# Patient Record
Sex: Female | Born: 1945 | Race: Black or African American | Hispanic: No | Marital: Single | Smoking: Never smoker
Health system: Southern US, Community
[De-identification: ages and names within clinical notes are randomized; demographics above are authoritative.]

## PROBLEM LIST (undated history)

## (undated) DIAGNOSIS — C801 Malignant (primary) neoplasm, unspecified: Secondary | ICD-10-CM

## (undated) DIAGNOSIS — I509 Heart failure, unspecified: Secondary | ICD-10-CM

## (undated) DIAGNOSIS — I10 Essential (primary) hypertension: Secondary | ICD-10-CM

## (undated) DIAGNOSIS — I2510 Atherosclerotic heart disease of native coronary artery without angina pectoris: Secondary | ICD-10-CM

## (undated) DIAGNOSIS — E119 Type 2 diabetes mellitus without complications: Secondary | ICD-10-CM

## (undated) DIAGNOSIS — E039 Hypothyroidism, unspecified: Secondary | ICD-10-CM

## (undated) DIAGNOSIS — E7800 Pure hypercholesterolemia, unspecified: Secondary | ICD-10-CM

## (undated) DIAGNOSIS — C50919 Malignant neoplasm of unspecified site of unspecified female breast: Secondary | ICD-10-CM

## (undated) DIAGNOSIS — I5020 Unspecified systolic (congestive) heart failure: Secondary | ICD-10-CM

## (undated) DIAGNOSIS — I428 Other cardiomyopathies: Secondary | ICD-10-CM

## (undated) HISTORY — DX: Malignant neoplasm of unspecified site of unspecified female breast: C50.919

## (undated) HISTORY — DX: Other cardiomyopathies: I42.8

## (undated) HISTORY — PX: CARDIAC CATHETERIZATION: SHX172

## (undated) HISTORY — DX: Unspecified systolic (congestive) heart failure: I50.20

## (undated) HISTORY — DX: Atherosclerotic heart disease of native coronary artery without angina pectoris: I25.10

---
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
## 1999-08-17 HISTORY — PX: BREAST SURGERY: SHX581

## 2015-03-28 NOTE — Discharge Instructions (Signed)

## 2015-03-31 ENCOUNTER — Ambulatory Visit: Payer: Managed Care, Other (non HMO) | Admitting: Anesthesiology

## 2015-03-31 ENCOUNTER — Encounter: Payer: Self-pay | Admitting: *Deleted

## 2015-03-31 ENCOUNTER — Encounter
Admission: RE | Disposition: A | Discharge status: Home / Self Care | Payer: Self-pay | Source: Ambulatory Visit | Attending: Ophthalmology

## 2015-03-31 ENCOUNTER — Ambulatory Visit
Admission: RE | Admit: 2015-03-31 | Discharge: 2015-03-31 | Disposition: A | Discharge status: Home / Self Care | Payer: Managed Care, Other (non HMO) | Source: Ambulatory Visit | Attending: Ophthalmology | Admitting: Ophthalmology

## 2015-03-31 DIAGNOSIS — Z79899 Other long term (current) drug therapy: Secondary | ICD-10-CM | POA: Diagnosis not present

## 2015-03-31 DIAGNOSIS — Z9889 Other specified postprocedural states: Secondary | ICD-10-CM | POA: Diagnosis not present

## 2015-03-31 DIAGNOSIS — E079 Disorder of thyroid, unspecified: Secondary | ICD-10-CM | POA: Diagnosis not present

## 2015-03-31 DIAGNOSIS — Z7982 Long term (current) use of aspirin: Secondary | ICD-10-CM | POA: Diagnosis not present

## 2015-03-31 DIAGNOSIS — Z853 Personal history of malignant neoplasm of breast: Secondary | ICD-10-CM | POA: Diagnosis not present

## 2015-03-31 DIAGNOSIS — I10 Essential (primary) hypertension: Secondary | ICD-10-CM | POA: Insufficient documentation

## 2015-03-31 DIAGNOSIS — E780 Pure hypercholesterolemia: Secondary | ICD-10-CM | POA: Insufficient documentation

## 2015-03-31 DIAGNOSIS — H2511 Age-related nuclear cataract, right eye: Secondary | ICD-10-CM | POA: Insufficient documentation

## 2015-03-31 HISTORY — DX: Type 2 diabetes mellitus without complications: E11.9

## 2015-03-31 HISTORY — PX: CATARACT EXTRACTION W/PHACO: SHX586

## 2015-03-31 HISTORY — DX: Malignant (primary) neoplasm, unspecified: C80.1

## 2015-03-31 HISTORY — DX: Hypothyroidism, unspecified: E03.9

## 2015-03-31 HISTORY — DX: Essential (primary) hypertension: I10

## 2015-03-31 HISTORY — DX: Pure hypercholesterolemia, unspecified: E78.00

## 2015-03-31 LAB — GLUCOSE, CAPILLARY
Glucose-Capillary: 108 mg/dL — ABNORMAL HIGH (ref 65–99)
Glucose-Capillary: 107 mg/dL — ABNORMAL HIGH (ref 65–99)

## 2015-03-31 SURGERY — PHACOEMULSIFICATION, CATARACT, WITH IOL INSERTION
Anesthesia: Monitor Anesthesia Care | Laterality: Right | Wound class: Clean

## 2015-03-31 MED ORDER — TETRACAINE HCL 0.5 % OP SOLN
1.0000 [drp] | OPHTHALMIC | Status: DC | PRN
  Administered 2015-03-31: 1 [drp] via OPHTHALMIC

## 2015-03-31 MED ORDER — EPINEPHRINE HCL 1 MG/ML IJ SOLN
INTRAOCULAR | Status: DC | PRN
  Administered 2015-03-31: 131 mL via OPHTHALMIC

## 2015-03-31 MED ORDER — CEFUROXIME OPHTHALMIC INJECTION 1 MG/0.1 ML
INJECTION | OPHTHALMIC | Status: DC | PRN
Start: 2015-03-31 — End: 2015-03-31
  Administered 2015-03-31: 0.1 mL via OPHTHALMIC

## 2015-03-31 MED ORDER — MIDAZOLAM HCL 2 MG/2ML IJ SOLN
INTRAMUSCULAR | Status: DC | PRN
  Administered 2015-03-31: 2 mg via INTRAVENOUS

## 2015-03-31 MED ORDER — LACTATED RINGERS IV SOLN: INTRAVENOUS | Status: DC

## 2015-03-31 MED ORDER — LIDOCAINE HCL (PF) 4 % IJ SOLN
INTRAMUSCULAR | Status: DC | PRN
  Administered 2015-03-31: 1 mL via OPHTHALMIC

## 2015-03-31 MED ORDER — TIMOLOL MALEATE 0.5 % OP SOLN
OPHTHALMIC | Status: DC | PRN
  Administered 2015-03-31: 1 [drp] via OPHTHALMIC

## 2015-03-31 MED ORDER — NA HYALUR & NA CHOND-NA HYALUR 0.55-0.5 ML IO KIT
PACK | INTRAOCULAR | Status: DC | PRN
  Administered 2015-03-31: 1 mL via INTRAOCULAR

## 2015-03-31 MED ORDER — POVIDONE-IODINE 5 % OP SOLN
1.0000 "application " | OPHTHALMIC | Status: DC | PRN
  Administered 2015-03-31: 1 via OPHTHALMIC

## 2015-03-31 MED ORDER — BRIMONIDINE TARTRATE 0.2 % OP SOLN
OPHTHALMIC | Status: DC | PRN
  Administered 2015-03-31: 1 [drp] via OPHTHALMIC

## 2015-03-31 MED ORDER — ARMC OPHTHALMIC DILATING GEL
1.0000 "application " | OPHTHALMIC | Status: DC | PRN
  Administered 2015-03-31 (×2): 1 via OPHTHALMIC

## 2015-03-31 MED ORDER — FENTANYL CITRATE (PF) 100 MCG/2ML IJ SOLN
INTRAMUSCULAR | Status: DC | PRN
  Administered 2015-03-31: 100 ug via INTRAVENOUS

## 2015-03-31 SURGICAL SUPPLY — 29 items
APPLICATOR COTTON TIP 3IN (MISCELLANEOUS) ×3 IMPLANT
CANNULA ANT/CHMB 27GA (MISCELLANEOUS) ×3 IMPLANT
DISSECTOR HYDRO NUCLEUS 50X22 (MISCELLANEOUS) ×3 IMPLANT
GLOVE BIO SURGEON STRL SZ7 (GLOVE) ×3 IMPLANT
GLOVE SURG LX 6.5 MICRO (GLOVE) ×2
GLOVE SURG LX STRL 6.5 MICRO (GLOVE) ×1 IMPLANT
GOWN STRL REUS W/ TWL LRG LVL3 (GOWN DISPOSABLE) ×2 IMPLANT
GOWN STRL REUS W/TWL LRG LVL3 (GOWN DISPOSABLE) ×4
LENS IOL ACRSF IQ PC 20.5 (Intraocular Lens) ×1 IMPLANT
LENS IOL ACRYSOF IQ POST 20.5 (Intraocular Lens) ×3 IMPLANT
MARKER SKIN SURG W/RULER VIO (MISCELLANEOUS) ×3 IMPLANT
NEEDLE FILTER BLUNT 18X 1/2SAF (NEEDLE) ×2
NEEDLE FILTER BLUNT 18X1 1/2 (NEEDLE) ×1 IMPLANT
PACK CATARACT BRASINGTON (MISCELLANEOUS) ×3 IMPLANT
PACK EYE AFTER SURG (MISCELLANEOUS) ×3 IMPLANT
PACK OPTHALMIC (MISCELLANEOUS) ×3 IMPLANT
RING MALYGIN 7.0 (MISCELLANEOUS) IMPLANT
SOL BAL SALT 15ML (MISCELLANEOUS)
SOLUTION BAL SALT 15ML (MISCELLANEOUS) IMPLANT
SUT ETHILON 10-0 CS-B-6CS-B-6 (SUTURE)
SUT VICRYL  9 0 (SUTURE)
SUT VICRYL 9 0 (SUTURE) IMPLANT
SUTURE EHLN 10-0 CS-B-6CS-B-6 (SUTURE) IMPLANT
SYR 3ML LL SCALE MARK (SYRINGE) ×3 IMPLANT
SYR TB 1ML LUER SLIP (SYRINGE) ×3 IMPLANT
WATER STERILE IRR 250ML POUR (IV SOLUTION) ×3 IMPLANT
WATER STERILE IRR 500ML POUR (IV SOLUTION) IMPLANT
WICK EYE OCUCEL (MISCELLANEOUS) IMPLANT
WIPE NON LINTING 3.25X3.25 (MISCELLANEOUS) ×3 IMPLANT

## 2015-03-31 NOTE — Anesthesia Preprocedure Evaluation (Signed)
Anesthesia Evaluation  Patient identified by MRN, date of birth, ID band  Reviewed: Allergy & Precautions, H&P , NPO status , Patient's Chart, lab work & pertinent test results  Airway Mallampati: II  TM Distance: >3 FB Neck ROM: full    Dental no notable dental hx.    Pulmonary    Pulmonary exam normal       Cardiovascular hypertension, Rhythm:regular Rate:Normal     Neuro/Psych    GI/Hepatic   Endo/Other  diabetesHypothyroidism   Renal/GU      Musculoskeletal   Abdominal   Peds  Hematology   Anesthesia Other Findings   Reproductive/Obstetrics                             Anesthesia Physical Anesthesia Plan  ASA: II  Anesthesia Plan: MAC   Post-op Pain Management:    Induction:   Airway Management Planned:   Additional Equipment:   Intra-op Plan:   Post-operative Plan:   Informed Consent: I have reviewed the patients History and Physical, chart, labs and discussed the procedure including the risks, benefits and alternatives for the proposed anesthesia with the patient or authorized representative who has indicated his/her understanding and acceptance.     Plan Discussed with: CRNA  Anesthesia Plan Comments:         Anesthesia Quick Evaluation

## 2015-03-31 NOTE — Transfer of Care (Signed)
Immediate Anesthesia Transfer of Care Note  Patient: Stacy Newman  Procedure(s) Performed: Procedure(s) with comments: CATARACT EXTRACTION PHACO AND INTRAOCULAR LENS PLACEMENT (IOC) (Right) - DIABETIC-ORAL MEDS  Patient Location: PACU  Anesthesia Type: MAC  Level of Consciousness: awake, alert  and patient cooperative  Airway and Oxygen Therapy: Patient Spontanous Breathing and Patient connected to supplemental oxygen  Post-op Assessment: Post-op Vital signs reviewed, Patient's Cardiovascular Status Stable, Respiratory Function Stable, Patent Airway and No signs of Nausea or vomiting  Post-op Vital Signs: Reviewed and stable  Complications: No apparent anesthesia complications

## 2015-03-31 NOTE — Op Note (Signed)
Date of Surgery: 03/31/2015  PREOPERATIVE DIAGNOSES: Visually significant nuclear sclerotic cataract, right eye.  POSTOPERATIVE DIAGNOSES: Same  PROCEDURES PERFORMED: Cataract extraction with intraocular lens implant, right eye.  SURGEON: Almon Hercules, M.D.  ANESTHESIA: MAC and topical  IMPLANTS: AcrySof IQ [id number] +20.5 D  Implant Name Type Inv. Item Serial No. Manufacturer Lot No. LRB No. Used  [id number] lens     [id number] ALCON   Right 1     COMPLICATIONS: None.  DESCRIPTION OF PROCEDURE: Therapeutic options were discussed with the patient preoperatively, including a discussion of risks and benefits of surgery. Informed consent was obtained. An IOL-Master and immersion biometry were used to take the lens measurements, and a dilated fundus exam was performed within 6 months of the surgical date.  The patient was premedicated and brought to the operating room and placed on the operating table in the supine position. After adequate anesthesia, the patient was prepped and draped in the usual sterile ophthalmic fashion. A wire lid speculum was inserted and the microscope was positioned. A Superblade was used to create a paracentesis site at the limbus and a small amount of dilute preservative free lidocaine was instilled into the anterior chamber, followed by dispersive viscoelastic. A clear corneal incision was created temporally using a 2.4 mm keratome blade. Capsulorrhexis was then performed. In situ phacoemulsification was performed.  Cortical material was removed with the irrigation-aspiration unit. Dispersive viscoelastic was instilled to open the capsular bag. A posterior chamber intraocular lens with the specifications above was inserted and positioned. Irrigation-aspiration was used to remove all viscoelastic. Cefuroxime 1cc was into the anterior chamber, and the corneal incision was checked and found to be water tight. The eyelid speculum was removed.  The operative eye was covered  with protective goggles after instilling 1 drop of timolol and brimonidine. The patient tolerated the procedure well. There were no complications.

## 2015-03-31 NOTE — Anesthesia Procedure Notes (Signed)
Procedure Name: MAC Performed by: Kaelen Caughlin Pre-anesthesia Checklist: Patient identified, Emergency Drugs available, Suction available, Timeout performed and Patient being monitored Patient Re-evaluated:Patient Re-evaluated prior to inductionOxygen Delivery Method: Nasal cannula Placement Confirmation: positive ETCO2     

## 2015-03-31 NOTE — Anesthesia Postprocedure Evaluation (Signed)
  Anesthesia Post-op Note  Patient: Stacy Newman  Procedure(s) Performed: Procedure(s) with comments: CATARACT EXTRACTION PHACO AND INTRAOCULAR LENS PLACEMENT (IOC) (Right) - DIABETIC-ORAL MEDS  Anesthesia type:MAC  Patient location: PACU  Post pain: Pain level controlled  Post assessment: Post-op Vital signs reviewed, Patient's Cardiovascular Status Stable, Respiratory Function Stable, Patent Airway and No signs of Nausea or vomiting  Post vital signs: Reviewed and stable  Last Vitals:  Filed Vitals:   03/31/15 1045  BP: 133/76  Pulse: 63  Temp:   Resp: 13    Level of consciousness: awake, alert  and patient cooperative  Complications: No apparent anesthesia complications

## 2015-04-01 ENCOUNTER — Encounter: Payer: Self-pay | Admitting: Ophthalmology

## 2015-12-23 ENCOUNTER — Encounter: Payer: Self-pay | Admitting: *Deleted

## 2015-12-25 NOTE — Discharge Instructions (Signed)

## 2015-12-29 ENCOUNTER — Ambulatory Visit: Payer: Managed Care, Other (non HMO) | Admitting: Anesthesiology

## 2015-12-29 ENCOUNTER — Ambulatory Visit
Admission: RE | Admit: 2015-12-29 | Discharge: 2015-12-29 | Disposition: A | Discharge status: Home / Self Care | Payer: Managed Care, Other (non HMO) | Source: Ambulatory Visit | Attending: Ophthalmology | Admitting: Ophthalmology

## 2015-12-29 ENCOUNTER — Encounter
Admission: RE | Disposition: A | Discharge status: Home / Self Care | Payer: Self-pay | Source: Ambulatory Visit | Attending: Ophthalmology

## 2015-12-29 DIAGNOSIS — Z7984 Long term (current) use of oral hypoglycemic drugs: Secondary | ICD-10-CM | POA: Insufficient documentation

## 2015-12-29 DIAGNOSIS — Z7982 Long term (current) use of aspirin: Secondary | ICD-10-CM | POA: Diagnosis not present

## 2015-12-29 DIAGNOSIS — E7800 Pure hypercholesterolemia, unspecified: Secondary | ICD-10-CM | POA: Insufficient documentation

## 2015-12-29 DIAGNOSIS — Z79899 Other long term (current) drug therapy: Secondary | ICD-10-CM | POA: Insufficient documentation

## 2015-12-29 DIAGNOSIS — Z9889 Other specified postprocedural states: Secondary | ICD-10-CM | POA: Diagnosis not present

## 2015-12-29 DIAGNOSIS — E079 Disorder of thyroid, unspecified: Secondary | ICD-10-CM | POA: Insufficient documentation

## 2015-12-29 DIAGNOSIS — H2512 Age-related nuclear cataract, left eye: Secondary | ICD-10-CM | POA: Insufficient documentation

## 2015-12-29 DIAGNOSIS — H269 Unspecified cataract: Secondary | ICD-10-CM | POA: Diagnosis present

## 2015-12-29 DIAGNOSIS — Z853 Personal history of malignant neoplasm of breast: Secondary | ICD-10-CM | POA: Insufficient documentation

## 2015-12-29 DIAGNOSIS — Z9841 Cataract extraction status, right eye: Secondary | ICD-10-CM | POA: Insufficient documentation

## 2015-12-29 DIAGNOSIS — E119 Type 2 diabetes mellitus without complications: Secondary | ICD-10-CM | POA: Diagnosis not present

## 2015-12-29 HISTORY — PX: CATARACT EXTRACTION W/PHACO: SHX586

## 2015-12-29 LAB — GLUCOSE, CAPILLARY
Glucose-Capillary: 114 mg/dL — ABNORMAL HIGH (ref 65–99)
Glucose-Capillary: 103 mg/dL — ABNORMAL HIGH (ref 65–99)

## 2015-12-29 SURGERY — PHACOEMULSIFICATION, CATARACT, WITH IOL INSERTION
Anesthesia: Monitor Anesthesia Care | Laterality: Left | Wound class: Clean

## 2015-12-29 MED ORDER — EPINEPHRINE HCL 1 MG/ML IJ SOLN
INTRAMUSCULAR | Status: DC | PRN
  Administered 2015-12-29: 77 mL via OPHTHALMIC

## 2015-12-29 MED ORDER — BRIMONIDINE TARTRATE 0.2 % OP SOLN
OPHTHALMIC | Status: DC | PRN
  Administered 2015-12-29: 1 [drp] via OPHTHALMIC

## 2015-12-29 MED ORDER — LIDOCAINE HCL (PF) 4 % IJ SOLN
INTRAOCULAR | Status: DC | PRN
  Administered 2015-12-29: 1 mL via OPHTHALMIC

## 2015-12-29 MED ORDER — TETRACAINE HCL 0.5 % OP SOLN
1.0000 [drp] | OPHTHALMIC | Status: DC | PRN
  Administered 2015-12-29: 1 [drp] via OPHTHALMIC

## 2015-12-29 MED ORDER — TIMOLOL MALEATE 0.5 % OP SOLN
OPHTHALMIC | Status: DC | PRN
  Administered 2015-12-29: 1 [drp] via OPHTHALMIC

## 2015-12-29 MED ORDER — CEFUROXIME OPHTHALMIC INJECTION 1 MG/0.1 ML
INJECTION | OPHTHALMIC | Status: DC | PRN
  Administered 2015-12-29: 0.1 mL via OPHTHALMIC

## 2015-12-29 MED ORDER — FENTANYL CITRATE (PF) 100 MCG/2ML IJ SOLN
INTRAMUSCULAR | Status: DC | PRN
  Administered 2015-12-29 (×2): 50 ug via INTRAVENOUS

## 2015-12-29 MED ORDER — NA HYALUR & NA CHOND-NA HYALUR 0.4-0.35 ML IO KIT
PACK | INTRAOCULAR | Status: DC | PRN
  Administered 2015-12-29: 1 mL via INTRAOCULAR

## 2015-12-29 MED ORDER — MIDAZOLAM HCL 2 MG/2ML IJ SOLN
INTRAMUSCULAR | Status: DC | PRN
  Administered 2015-12-29: 2 mg via INTRAVENOUS

## 2015-12-29 MED ORDER — ARMC OPHTHALMIC DILATING GEL
1.0000 "application " | OPHTHALMIC | Status: DC | PRN
  Administered 2015-12-29 (×2): 1 via OPHTHALMIC

## 2015-12-29 MED ORDER — POVIDONE-IODINE 5 % OP SOLN
1.0000 "application " | OPHTHALMIC | Status: DC | PRN
  Administered 2015-12-29: 1 via OPHTHALMIC

## 2015-12-29 SURGICAL SUPPLY — 28 items

## 2015-12-29 NOTE — Anesthesia Preprocedure Evaluation (Signed)
Anesthesia Evaluation  Patient identified by MRN, date of birth, ID band Patient awake    Reviewed: Allergy & Precautions, NPO status , Patient's Chart, lab work & pertinent test results  Airway Mallampati: II  TM Distance: >3 FB Neck ROM: Full    Dental no notable dental hx.    Pulmonary neg pulmonary ROS,    Pulmonary exam normal breath sounds clear to auscultation       Cardiovascular hypertension, Normal cardiovascular exam Rhythm:Regular Rate:Normal     Neuro/Psych negative neurological ROS  negative psych ROS   GI/Hepatic negative GI ROS, Neg liver ROS,   Endo/Other  diabetesHypothyroidism   Renal/GU negative Renal ROS  negative genitourinary   Musculoskeletal negative musculoskeletal ROS (+)   Abdominal   Peds negative pediatric ROS (+)  Hematology negative hematology ROS (+)   Anesthesia Other Findings   Reproductive/Obstetrics negative OB ROS                             Anesthesia Physical Anesthesia Plan  ASA: II  Anesthesia Plan: MAC   Post-op Pain Management:    Induction: Intravenous  Airway Management Planned:   Additional Equipment:   Intra-op Plan:   Post-operative Plan: Extubation in OR  Informed Consent: I have reviewed the patients History and Physical, chart, labs and discussed the procedure including the risks, benefits and alternatives for the proposed anesthesia with the patient or authorized representative who has indicated his/her understanding and acceptance.   Dental advisory given  Plan Discussed with: CRNA  Anesthesia Plan Comments:         Anesthesia Quick Evaluation

## 2015-12-29 NOTE — Anesthesia Procedure Notes (Signed)
Procedure Name: MAC Performed by: Adasia Hoar Pre-anesthesia Checklist: Patient identified, Emergency Drugs available, Suction available, Timeout performed and Patient being monitored Patient Re-evaluated:Patient Re-evaluated prior to inductionOxygen Delivery Method: Nasal cannula Placement Confirmation: positive ETCO2       

## 2015-12-29 NOTE — Op Note (Signed)
Date of Surgery: 12/29/2015  PREOPERATIVE DIAGNOSES: Visually significant nuclear sclerotic cataract, left eye.  POSTOPERATIVE DIAGNOSES: Same  PROCEDURES PERFORMED: Cataract extraction with intraocular lens implant, left eye.  SURGEON: Almon Hercules, M.D.  ANESTHESIA: MAC and topical  IMPLANTS: [id number] +20.5 D   Implant Name Type Inv. Item Serial No. Manufacturer Lot No. LRB No. Used  20.5D auooto lens     [id number] ALCON   Left 1    COMPLICATIONS: None.  DESCRIPTION OF PROCEDURE: Therapeutic options were discussed with the patient preoperatively, including a discussion of risks and benefits of surgery. Informed consent was obtained. An IOL-Master and immersion biometry were used to take the lens measurements, and a dilated fundus exam was performed within 6 months of the surgical date.  The patient was premedicated and brought to the operating room and placed on the operating table in the supine position. After adequate anesthesia, the patient was prepped and draped in the usual sterile ophthalmic fashion. A wire lid speculum was inserted and the microscope was positioned. A Superblade was used to create a paracentesis site at the limbus and a small amount of dilute preservative free lidocaine was instilled into the anterior chamber, followed by dispersive viscoelastic. A clear corneal incision was created temporally using a 2.4 mm keratome blade. Capsulorrhexis was then performed. In situ phacoemulsification was performed.  Cortical material was removed with the irrigation-aspiration unit. Dispersive viscoelastic was instilled to open the capsular bag. A posterior chamber intraocular lens with the specifications above was inserted and positioned. Irrigation-aspiration was used to remove all viscoelastic. Cefuroxime 1cc was instilled into the anterior chamber, and the corneal incision was checked and found to be water tight. The eyelid speculum was removed.  The operative eye was covered  with protective goggles after instilling 1 drop of timolol and brimonidine. The patient tolerated the procedure well. There were no complications.

## 2015-12-29 NOTE — Transfer of Care (Signed)
Immediate Anesthesia Transfer of Care Note  Patient: Stacy Newman  Procedure(s) Performed: Procedure(s) with comments: CATARACT EXTRACTION PHACO AND INTRAOCULAR LENS PLACEMENT (IOC) (Left) - DIABETIC - oral meds LEAVE PT AT 830 ARRIVAL TIME  Patient Location: PACU  Anesthesia Type: MAC  Level of Consciousness: awake, alert  and patient cooperative  Airway and Oxygen Therapy: Patient Spontanous Breathing and Patient connected to supplemental oxygen  Post-op Assessment: Post-op Vital signs reviewed, Patient's Cardiovascular Status Stable, Respiratory Function Stable, Patent Airway and No signs of Nausea or vomiting  Post-op Vital Signs: Reviewed and stable  Complications: No apparent anesthesia complications

## 2015-12-29 NOTE — Anesthesia Postprocedure Evaluation (Signed)
Anesthesia Post Note  Patient: Stacy Newman  Procedure(s) Performed: Procedure(s) (LRB): CATARACT EXTRACTION PHACO AND INTRAOCULAR LENS PLACEMENT (IOC) (Left)  Patient location during evaluation: PACU Anesthesia Type: MAC Level of consciousness: awake and alert Pain management: pain level controlled Vital Signs Assessment: post-procedure vital signs reviewed and stable Respiratory status: spontaneous breathing, nonlabored ventilation, respiratory function stable and patient connected to nasal cannula oxygen Cardiovascular status: stable and blood pressure returned to baseline Anesthetic complications: no    Gaylen Pereira C

## 2015-12-29 NOTE — H&P (Signed)
H+P reviewed and is up to date, please see paper chart.  

## 2015-12-30 ENCOUNTER — Encounter: Payer: Self-pay | Admitting: Ophthalmology

## 2018-02-13 ENCOUNTER — Other Ambulatory Visit: Payer: Self-pay | Admitting: Family Medicine

## 2018-02-13 DIAGNOSIS — R011 Cardiac murmur, unspecified: Secondary | ICD-10-CM

## 2018-02-21 ENCOUNTER — Telehealth: Payer: Self-pay | Admitting: Internal Medicine

## 2018-02-21 ENCOUNTER — Ambulatory Visit
Admission: RE | Admit: 2018-02-21 | Discharge: 2018-02-21 | Disposition: A | Discharge status: Home / Self Care | Payer: BLUE CROSS/BLUE SHIELD | Source: Ambulatory Visit | Attending: Family Medicine | Admitting: Family Medicine

## 2018-02-21 DIAGNOSIS — I340 Nonrheumatic mitral (valve) insufficiency: Secondary | ICD-10-CM | POA: Insufficient documentation

## 2018-02-21 DIAGNOSIS — I10 Essential (primary) hypertension: Secondary | ICD-10-CM | POA: Diagnosis not present

## 2018-02-21 DIAGNOSIS — E119 Type 2 diabetes mellitus without complications: Secondary | ICD-10-CM | POA: Insufficient documentation

## 2018-02-21 DIAGNOSIS — R011 Cardiac murmur, unspecified: Secondary | ICD-10-CM | POA: Diagnosis present

## 2018-02-21 NOTE — Progress Notes (Signed)
*  PRELIMINARY RESULTS* Echocardiogram 2D Echocardiogram has been performed.  Stacy Newman 02/21/2018, 11:45 AM

## 2018-02-21 NOTE — Telephone Encounter (Signed)
Lmov for patient to call and schedule appointment ° °

## 2018-02-21 NOTE — Telephone Encounter (Signed)
Spoke with PCP office Scheduled to see Dr. Saunders Revel on 7/10 at 3:40 p

## 2018-02-21 NOTE — Telephone Encounter (Signed)
-----   Message from Nelva Bush, MD sent at 02/21/2018 12:29 PM EDT ----- Regarding: New patient referral Hello ladies,  I just read an echo for this patient, which shows reduced LVEF.  I spoke with her PCP in [facility], who will try to refer her to see is.  Can we try to get her in within the next 1-2 weeks?  If there are no openings, you can add her on to my clinic tomorrow afternoon or next Wednesday.  Let me know if any questions come up.  Thanks.  Gerald Stabs

## 2018-02-22 ENCOUNTER — Ambulatory Visit: Payer: BLUE CROSS/BLUE SHIELD | Admitting: Internal Medicine

## 2018-02-22 ENCOUNTER — Encounter: Payer: Self-pay | Admitting: Internal Medicine

## 2018-02-22 VITALS — BP 136/85 | HR 99 | Ht 64.0 in | Wt 157.0 lb

## 2018-02-22 DIAGNOSIS — I429 Cardiomyopathy, unspecified: Secondary | ICD-10-CM

## 2018-02-22 DIAGNOSIS — Z01810 Encounter for preprocedural cardiovascular examination: Secondary | ICD-10-CM | POA: Diagnosis not present

## 2018-02-22 MED ORDER — CARVEDILOL 6.25 MG PO TABS: 6.2500 mg | ORAL_TABLET | Freq: Two times a day (BID) | ORAL | 3 refills | Status: DC

## 2018-02-22 NOTE — H&P (View-Only) (Signed)
New Outpatient Visit Date: 02/22/2018  Referring Provider: Lonn Georgia, NP [facility]  Chief Complaint: Heart murmur and abnormal echocardiogram  HPI:  Stacy Newman is a 72 y.o. female who is being seen today for the evaluation of heart murmur and abnormal echocardiogram at the request of Stacy Newman. She has a history of hypertension, hyperlipidemia, type 2 diabetes mellitus, hypothyroidism, and remote left breast cancer status post lumpectomy and chemoradiation.  Stacy Newman was evaluated earlier this month by her PCP in [facility] due to preceding cough.  At that time, cardiac murmur was appreciated.  Stacy Newman was referred for echocardiogram that revealed moderately reduced LVEF with anterior wall motion abnormality.  Today, Stacy Newman reports feeling well.  Approximately 3 weeks ago, she had an episode of persistent coughing that she attributed to moving from one building to another.  This has since resolved.  She did not have any other symptoms other than 1 day of leg swelling last week that resolved spontaneously.  She noted some fatigue in May, but attributes that to a very busy work schedule and less sleep.  She has not had any chest pain, palpitations, lightheadedness, orthopnea, or PND.  She has never undergone previous cardiac testing.  She notes that her great great grandmother had some sort of a heart problem, though she does not know specifics.  She otherwise denies a family history of cardiac disease.  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  --------------------------------------------------------------------------------------------------  Cardiovascular History & Procedures: Cardiovascular Problems:  Cardiomyopathy  Risk Factors:  Hypertension, hyperlipidemia, diabetes mellitus, and age greater than 11  Cath/PCI:  None  CV Surgery:  None  EP Procedures and Devices:  None  Non-Invasive  Evaluation(s):  Echocardiogram (02/21/2018): LV size at upper limits of normal with normal wall thickness.  LVEF 35 to 40% with diffuse hypokinesis and severe hypokinesis of the anterior and anteroseptal myocardium.  Grade 1 diastolic dysfunction with elevated filling pressures.  Mild calcified aortic valve annulus without stenosis or regurgitation.  Mild MR.  Normal RV size and function.  Elevated central venous pressure with blunted respirophasic changes of the IVC.  --------------------------------------------------------------------------------------------------  Past Medical History:  Diagnosis Date  . Cancer (HCC)    BREAST  . Diabetes mellitus without complication (HCC)    TYPE 11  . Hypercholesterolemia   . Hypertension   . Hypothyroidism     Past Surgical History:  Procedure Laterality Date  . BREAST SURGERY  2001   LUMPECTOMY LEFT SIDE  . CATARACT EXTRACTION W/PHACO Right 03/31/2015   Procedure: CATARACT EXTRACTION PHACO AND INTRAOCULAR LENS PLACEMENT (IOC);  Surgeon: Ronnell Freshwater, MD;  Location: [place];  Service: Ophthalmology;  Laterality: Right;  DIABETIC-ORAL MEDS  . CATARACT EXTRACTION W/PHACO Left 12/29/2015   Procedure: CATARACT EXTRACTION PHACO AND INTRAOCULAR LENS PLACEMENT (IOC);  Surgeon: Ronnell Freshwater, MD;  Location: [place];  Service: Ophthalmology;  Laterality: Left;  DIABETIC - oral meds LEAVE PT AT 830 ARRIVAL TIME    Current Meds  Medication Sig  . aspirin 81 MG tablet Take 81 mg by mouth daily. LUNCH  . Cyanocobalamin (VITAMIN B12) 3000 MCG/ML LIQD Place 1 Dose under the tongue.  Marland Kitchen glipiZIDE (GLUCOTROL) 5 MG tablet Take by mouth daily. BEDTIME  . hydrochlorothiazide (HYDRODIURIL) 25 MG tablet Take 25 mg by mouth daily. LUNCH  . levothyroxine (SYNTHROID, LEVOTHROID) 50 MCG tablet Take 50 mcg by mouth daily before breakfast.  . lisinopril (  PRINIVIL,ZESTRIL) 10 MG tablet Take 10 mg by mouth daily. BREAKSFAS  .  metFORMIN (GLUCOPHAGE) 500 MG tablet Take 500 mg by mouth daily. LUNCH  . Multiple Vitamins-Minerals (MULTIVITAL PO) Take 1 tablet by mouth every other day.  . potassium chloride (K-DUR) 10 MEQ tablet Take 10 mEq by mouth daily. LUNCH  . simvastatin (ZOCOR) 40 MG tablet Take 40 mg by mouth daily. Reported on 12/29/2015  . Vitamin D, Ergocalciferol, (DRISDOL) 50000 UNITS CAPS capsule Take 50,000 Units by mouth every 7 (seven) days.  . [DISCONTINUED] atenolol (TENORMIN) 25 MG tablet Take 25 mg by mouth daily. BEDTIME    Allergies: Food color red [red dye]  Social History   Tobacco Use  . Smoking status: Never Smoker  . Smokeless tobacco: Never Used  Substance Use Topics  . Alcohol use: No  . Drug use: Never    Family History  Problem Relation Age of Onset  . Alzheimer's disease Mother   . Lung cancer Father     Review of Systems: A 12-system review of systems was performed and was negative except as noted in the HPI.  --------------------------------------------------------------------------------------------------  Physical Exam: BP 136/85 (BP Location: Right Arm, Patient Position: Sitting, Cuff Size: Normal)   Pulse 99   Ht 5\' 4"  (1.626 m)   Wt 157 lb (71.2 kg)   BMI 26.95 kg/m   General: NAD. HEENT: No conjunctival pallor or scleral icterus. Moist mucous membranes. OP clear. Neck: Supple without lymphadenopathy, thyromegaly, JVD, or HJR. No carotid bruit. Lungs: Normal work of breathing. Clear to auscultation bilaterally without wheezes or crackles. Heart: Regular rate and rhythm with 2/6 holosystolic murmur loudest at the left lower sternal border.. Non-displaced PMI. Abd: Bowel sounds present. Soft, NT/ND without hepatosplenomegaly Ext: No lower extremity edema. Radial, PT, and DP pulses are 2+ bilaterally Skin: Warm and dry without rash. Neuro: CNIII-XII intact. Strength and fine-touch sensation intact in upper and lower extremities bilaterally. Psych: Normal mood  and affect.  EKG: Normal sinus rhythm with left axis deviation and nonspecific T wave changes.  Outside labs (08/10/2017): Sodium 137, potassium 4.6, chloride 99, CO2 22, BUN 36, creatinine 0.9, calcium 9.7, glucose 75, AST 21, ALT 9, alkaline phosphatase 103, total bilirubin 0.3, total protein 8.0, albumin 4.2  Hemoglobin A1c: 6.4%  Lipid panel: Total cholesterol 202, triglycerides 41, HDL 74, LDL 120  TSH 0.177  CBC WBC 3.3, hemoglobin 9.7, MCV 80, platelet 326  --------------------------------------------------------------------------------------------------  ASSESSMENT AND PLAN: Cardiomyopathy Newly discovered cardiomyopathy in the setting of murmur appreciated on recent visit with PCP.  Echo showed moderately reduced LVEF with global hypokinesis but more severe hypokinesis involving the anterior and anteroseptal walls.  Stacy Newman has been minimally symptomatic consistent with NYHA class I-II heart failure, I am concerned that she may have underlying CAD that has been clinically silent.  Given history of breast cancer with chemoradiation, chemotherapy induced cardiomyopathy is also a consideration.  We have discussed further evaluation options and have agreed to proceed with left and right heart catheterization for further evaluation.  We will check routine precatheterization labs today with close attention paid also to her hemoglobin which was moderately reduced on outside labs in December.  I have reviewed the risks, indications, and alternatives to cardiac catheterization, possible angioplasty, and stenting with the patient. Risks include but are not limited to bleeding, infection, vascular injury, stroke, myocardial infection, arrhythmia, kidney injury, radiation-related injury in the case of prolonged fluoroscopy use, emergency cardiac surgery, and death. The patient understands the risks of  serious complication is 1-2 in 5027 with diagnostic cardiac cath and 1-2% or less with  angioplasty/stenting.  I have recommended switching atenolol to carvedilol 6.25 mg twice daily to optimize evidence-based heart failure therapy.  Ms. therapy should continue lisinopril 10 mg daily.  Follow-up: Return to clinic in 4 weeks.  Nelva Bush, MD 02/23/2018 8:58 AM

## 2018-02-22 NOTE — Patient Instructions (Addendum)
Medication Instructions:  Your physician has recommended you make the following change in your medication:  1- STOP Atenolol. 2- START Carvedilol 6.25 mg by mouth two times a day.   Labwork: Your physician recommends that you return for lab work in: TODAY or TOMORROW (CBC, CMET). -Please go to the [facility]. You will check in at the front desk to the right as you walk into the atrium. Valet Parking is offered if needed.    Testing/Procedures: Your physician has requested that you have a cardiac catheterization. Cardiac catheterization is used to diagnose and/or treat various heart conditions. Doctors may recommend this procedure for a number of different reasons. The most common reason is to evaluate chest pain. Chest pain can be a symptom of coronary artery disease (CAD), and cardiac catheterization can show whether plaque is narrowing or blocking your heart's arteries. This procedure is also used to evaluate the valves, as well as measure the blood flow and oxygen levels in different parts of your heart. For further information please visit [email or web address]. Please follow instruction sheet, as given.  [facility] Cardiac Cath Instructions   You are scheduled for a Cardiac Cath on:__07/16/2019______  Please arrive at __07:30 _am on the day of your procedure  Please expect a call from our [facility] to pre-register you  Do not eat/drink anything after midnight  Someone will need to drive you home  It is recommended someone be with you for the first 24 hours after your procedure  Wear clothes that are easy to get on/off and wear slip on shoes if possible   Medications bring a current list of all medications with you.  Do not take these medications before your procedure:_______METFORMIN, GLIPIZIDE AND HYDROCHLOROTHIZIDE THE MORNING OF PROCEDURE. ______  Day of your procedure: Arrive at the [facility] entrance.  Free valet service is available.  After  entering the [facility] please check-in at the registration desk (1st desk on your right) to receive your armband. After receiving your armband someone will escort you to the cardiac cath/special procedures waiting area.  The usual length of stay after your procedure is about 2 to 3 hours.  This can vary.  If you have any questions, please call our office at [phone], or you may call the cardiac cath lab at [facility] directly at [phone]   Follow-Up: Your physician recommends that you schedule a follow-up appointment in: 1 MONTH WITH APP.  If you need a refill on your cardiac medications before your next appointment, please call your pharmacy.   Coronary Angiogram With Stent Coronary angiogram with stent placement is a procedure to widen or open a narrow blood vessel of the heart (coronary artery). Arteries may become blocked by cholesterol buildup (plaques) in the lining of the wall. When a coronary artery becomes partially blocked, blood flow to that area decreases. This may lead to chest pain or a heart attack (myocardial infarction). A stent is a small piece of metal that looks like mesh or a spring. Stent placement may be done as treatment for a heart attack or right after a coronary angiogram in which a blocked artery is found. Let your health care provider know about:  Any allergies you have.  All medicines you are taking, including vitamins, herbs, eye drops, creams, and over-the-counter medicines.  Any problems you or family members have had with anesthetic medicines.  Any blood disorders you have.  Any surgeries you have had.  Any medical conditions you have.  Whether you  are pregnant or may be pregnant. What are the risks? Generally, this is a safe procedure. However, problems may occur, including:  Damage to the heart or its blood vessels.  A return of blockage.  Bleeding, infection, or bruising at the insertion site.  A collection of blood under the skin  (hematoma) at the insertion site.  A blood clot in another part of the body.  Kidney injury.  Allergic reaction to the dye or contrast that is used.  Bleeding into the abdomen (retroperitoneal bleeding).  What happens before the procedure? Staying hydrated Follow instructions from your health care provider about hydration, which may include:  Up to 2 hours before the procedure - you may continue to drink clear liquids, such as water, clear fruit juice, black coffee, and plain tea.  Eating and drinking restrictions Follow instructions from your health care provider about eating and drinking, which may include:  8 hours before the procedure - stop eating heavy meals or foods such as meat, fried foods, or fatty foods.  6 hours before the procedure - stop eating light meals or foods, such as toast or cereal.  2 hours before the procedure - stop drinking clear liquids.  Ask your health care provider about:  Changing or stopping your regular medicines. This is especially important if you are taking diabetes medicines or blood thinners.  Taking medicines such as ibuprofen. These medicines can thin your blood. Do not take these medicines before your procedure if your health care provider instructs you not to. Generally, aspirin is recommended before a procedure of passing a small, thin tube (catheter) through a blood vessel and into the heart (cardiac catheterization).  What happens during the procedure?  An IV tube will be inserted into one of your veins.  You will be given one or more of the following: ? A medicine to help you relax (sedative). ? A medicine to numb the area where the catheter will be inserted into an artery (local anesthetic).  To reduce your risk of infection: ? Your health care team will wash or sanitize their hands. ? Your skin will be washed with soap. ? Hair may be removed from the area where the catheter will be inserted.  Using a guide wire, the catheter  will be inserted into an artery. The location may be in your groin, in your wrist, or in the fold of your arm (near your elbow).  A type of X-ray (fluoroscopy) will be used to help guide the catheter to the opening of the arteries in the heart.  A dye will be injected into the catheter, and X-rays will be taken. The dye will help to show where any narrowing or blockages are located in the arteries.  A tiny wire will be guided to the blocked spot, and a balloon will be inflated to make the artery wider.  The stent will be expanded and will crush the plaques into the wall of the vessel. The stent will hold the area open and improve the blood flow. Most stents have a drug coating to reduce the risk of the stent narrowing over time.  The artery may be made wider using a drill, laser, or other tools to remove plaques.  When the blood flow is better, the catheter will be removed. The lining of the artery will grow over the stent, which stays where it was placed. This procedure may vary among health care providers and hospitals. What happens after the procedure?  If the procedure is done  through the leg, you will be kept in bed lying flat for about 6 hours. You will be instructed to not bend and not cross your legs.  The insertion site will be checked frequently.  The pulse in your foot or wrist will be checked frequently.  You may have additional blood tests, X-rays, and a test that records the electrical activity of your heart (electrocardiogram, or ECG). This information is not intended to replace advice given to you by your health care provider. Make sure you discuss any questions you have with your health care provider. Document Released: 02/06/2003 Document Revised: 04/01/2016 Document Reviewed: 03/07/2016 Elsevier Interactive Patient Education  [employer].

## 2018-02-22 NOTE — Progress Notes (Signed)
New Outpatient Visit Date: 02/22/2018  Referring Provider: Lonn Georgia, NP [facility]  Chief Complaint: Heart murmur and abnormal echocardiogram  HPI:  Stacy Newman is a 72 y.o. female who is being seen today for the evaluation of heart murmur and abnormal echocardiogram at the request of Stacy Newman. She has a history of hypertension, hyperlipidemia, type 2 diabetes mellitus, hypothyroidism, and remote left breast cancer status post lumpectomy and chemoradiation.  Stacy Newman was evaluated earlier this month by her PCP in [facility] due to preceding cough.  At that time, cardiac murmur was appreciated.  Stacy Newman was referred for echocardiogram that revealed moderately reduced LVEF with anterior wall motion abnormality.  Today, Stacy Newman reports feeling well.  Approximately 3 weeks ago, she had an episode of persistent coughing that she attributed to moving from one building to another.  This has since resolved.  She did not have any other symptoms other than 1 day of leg swelling last week that resolved spontaneously.  She noted some fatigue in May, but attributes that to a very busy work schedule and less sleep.  She has not had any chest pain, palpitations, lightheadedness, orthopnea, or PND.  She has never undergone previous cardiac testing.  She notes that her great great grandmother had some sort of a heart problem, though she does not know specifics.  She otherwise denies a family history of cardiac disease.  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  --------------------------------------------------------------------------------------------------  Cardiovascular History & Procedures: Cardiovascular Problems:  Cardiomyopathy  Risk Factors:  Hypertension, hyperlipidemia, diabetes mellitus, and age greater than 44  Cath/PCI:  None  CV Surgery:  None  EP Procedures and Devices:  None  Non-Invasive  Evaluation(s):  Echocardiogram (02/21/2018): LV size at upper limits of normal with normal wall thickness.  LVEF 35 to 40% with diffuse hypokinesis and severe hypokinesis of the anterior and anteroseptal myocardium.  Grade 1 diastolic dysfunction with elevated filling pressures.  Mild calcified aortic valve annulus without stenosis or regurgitation.  Mild MR.  Normal RV size and function.  Elevated central venous pressure with blunted respirophasic changes of the IVC.  --------------------------------------------------------------------------------------------------  Past Medical History:  Diagnosis Date  . Cancer (HCC)    BREAST  . Diabetes mellitus without complication (HCC)    TYPE 11  . Hypercholesterolemia   . Hypertension   . Hypothyroidism     Past Surgical History:  Procedure Laterality Date  . BREAST SURGERY  2001   LUMPECTOMY LEFT SIDE  . CATARACT EXTRACTION W/PHACO Right 03/31/2015   Procedure: CATARACT EXTRACTION PHACO AND INTRAOCULAR LENS PLACEMENT (IOC);  Surgeon: Ronnell Freshwater, MD;  Location: [facility];  Service: Ophthalmology;  Laterality: Right;  DIABETIC-ORAL MEDS  . CATARACT EXTRACTION W/PHACO Left 12/29/2015   Procedure: CATARACT EXTRACTION PHACO AND INTRAOCULAR LENS PLACEMENT (IOC);  Surgeon: Ronnell Freshwater, MD;  Location: [facility];  Service: Ophthalmology;  Laterality: Left;  DIABETIC - oral meds LEAVE PT AT 830 ARRIVAL TIME    Current Meds  Medication Sig  . aspirin 81 MG tablet Take 81 mg by mouth daily. LUNCH  . Cyanocobalamin (VITAMIN B12) 3000 MCG/ML LIQD Place 1 Dose under the tongue.  Marland Kitchen glipiZIDE (GLUCOTROL) 5 MG tablet Take by mouth daily. BEDTIME  . hydrochlorothiazide (HYDRODIURIL) 25 MG tablet Take 25 mg by mouth daily. LUNCH  . levothyroxine (SYNTHROID, LEVOTHROID) 50 MCG tablet Take 50 mcg by mouth daily before breakfast.  . lisinopril (  PRINIVIL,ZESTRIL) 10 MG tablet Take 10 mg by mouth daily. BREAKSFAS  .  metFORMIN (GLUCOPHAGE) 500 MG tablet Take 500 mg by mouth daily. LUNCH  . Multiple Vitamins-Minerals (MULTIVITAL PO) Take 1 tablet by mouth every other day.  . potassium chloride (K-DUR) 10 MEQ tablet Take 10 mEq by mouth daily. LUNCH  . simvastatin (ZOCOR) 40 MG tablet Take 40 mg by mouth daily. Reported on 12/29/2015  . Vitamin D, Ergocalciferol, (DRISDOL) 50000 UNITS CAPS capsule Take 50,000 Units by mouth every 7 (seven) days.  . [DISCONTINUED] atenolol (TENORMIN) 25 MG tablet Take 25 mg by mouth daily. BEDTIME    Allergies: Food color red [red dye]  Social History   Tobacco Use  . Smoking status: Never Smoker  . Smokeless tobacco: Never Used  Substance Use Topics  . Alcohol use: No  . Drug use: Never    Family History  Problem Relation Age of Onset  . Alzheimer's disease Mother   . Lung cancer Father     Review of Systems: A 12-system review of systems was performed and was negative except as noted in the HPI.  --------------------------------------------------------------------------------------------------  Physical Exam: BP 136/85 (BP Location: Right Arm, Patient Position: Sitting, Cuff Size: Normal)   Pulse 99   Ht 5\' 4"  (1.626 m)   Wt 157 lb (71.2 kg)   BMI 26.95 kg/m   General: NAD. HEENT: No conjunctival pallor or scleral icterus. Moist mucous membranes. OP clear. Neck: Supple without lymphadenopathy, thyromegaly, JVD, or HJR. No carotid bruit. Lungs: Normal work of breathing. Clear to auscultation bilaterally without wheezes or crackles. Heart: Regular rate and rhythm with 2/6 holosystolic murmur loudest at the left lower sternal border.. Non-displaced PMI. Abd: Bowel sounds present. Soft, NT/ND without hepatosplenomegaly Ext: No lower extremity edema. Radial, PT, and DP pulses are 2+ bilaterally Skin: Warm and dry without rash. Neuro: CNIII-XII intact. Strength and fine-touch sensation intact in upper and lower extremities bilaterally. Psych: Normal mood  and affect.  EKG: Normal sinus rhythm with left axis deviation and nonspecific T wave changes.  Outside labs (08/10/2017): Sodium 137, potassium 4.6, chloride 99, CO2 22, BUN 36, creatinine 0.9, calcium 9.7, glucose 75, AST 21, ALT 9, alkaline phosphatase 103, total bilirubin 0.3, total protein 8.0, albumin 4.2  Hemoglobin A1c: 6.4%  Lipid panel: Total cholesterol 202, triglycerides 41, HDL 74, LDL 120  TSH 0.177  CBC WBC 3.3, hemoglobin 9.7, MCV 80, platelet 326  --------------------------------------------------------------------------------------------------  ASSESSMENT AND PLAN: Cardiomyopathy Newly discovered cardiomyopathy in the setting of murmur appreciated on recent visit with PCP.  Echo showed moderately reduced LVEF with global hypokinesis but more severe hypokinesis involving the anterior and anteroseptal walls.  Stacy Newman has been minimally symptomatic consistent with NYHA class I-II heart failure, I am concerned that she may have underlying CAD that has been clinically silent.  Given history of breast cancer with chemoradiation, chemotherapy induced cardiomyopathy is also a consideration.  We have discussed further evaluation options and have agreed to proceed with left and right heart catheterization for further evaluation.  We will check routine precatheterization labs today with close attention paid also to her hemoglobin which was moderately reduced on outside labs in December.  I have reviewed the risks, indications, and alternatives to cardiac catheterization, possible angioplasty, and stenting with the patient. Risks include but are not limited to bleeding, infection, vascular injury, stroke, myocardial infection, arrhythmia, kidney injury, radiation-related injury in the case of prolonged fluoroscopy use, emergency cardiac surgery, and death. The patient understands the risks of  serious complication is 1-2 in 7183 with diagnostic cardiac cath and 1-2% or less with  angioplasty/stenting.  I have recommended switching atenolol to carvedilol 6.25 mg twice daily to optimize evidence-based heart failure therapy.  Ms. therapy should continue lisinopril 10 mg daily.  Follow-up: Return to clinic in 4 weeks.  Nelva Bush, MD 02/23/2018 8:58 AM

## 2018-02-23 ENCOUNTER — Other Ambulatory Visit
Admission: RE | Admit: 2018-02-23 | Discharge: 2018-02-23 | Disposition: A | Discharge status: Home / Self Care | Payer: BLUE CROSS/BLUE SHIELD | Source: Ambulatory Visit | Attending: Internal Medicine | Admitting: Internal Medicine

## 2018-02-23 DIAGNOSIS — Z01810 Encounter for preprocedural cardiovascular examination: Secondary | ICD-10-CM

## 2018-02-23 DIAGNOSIS — I429 Cardiomyopathy, unspecified: Secondary | ICD-10-CM | POA: Insufficient documentation

## 2018-02-23 DIAGNOSIS — I428 Other cardiomyopathies: Secondary | ICD-10-CM | POA: Insufficient documentation

## 2018-02-23 LAB — CBC WITH DIFFERENTIAL/PLATELET
Basophils Absolute: 0 10*3/uL (ref 0–0.1)
Basophils Relative: 1 %
EOS ABS: 0.1 10*3/uL (ref 0–0.7)
Eosinophils Relative: 3 %
HEMATOCRIT: 33.1 % — AB (ref 35.0–47.0)
HEMOGLOBIN: 10.9 g/dL — AB (ref 12.0–16.0)
LYMPHS ABS: 1.4 10*3/uL (ref 1.0–3.6)
LYMPHS PCT: 37 %
MCH: 27.3 pg (ref 26.0–34.0)
MCHC: 32.9 g/dL (ref 32.0–36.0)
MCV: 83.2 fL (ref 80.0–100.0)
MONOS PCT: 15 %
Monocytes Absolute: 0.6 10*3/uL (ref 0.2–0.9)
Neutro Abs: 1.7 10*3/uL (ref 1.4–6.5)
Neutrophils Relative %: 44 %
Platelets: 265 10*3/uL (ref 150–440)
RBC: 3.98 MIL/uL (ref 3.80–5.20)
RDW: 15.8 % — ABNORMAL HIGH (ref 11.5–14.5)
WBC: 3.7 10*3/uL (ref 3.6–11.0)

## 2018-02-23 LAB — COMPREHENSIVE METABOLIC PANEL
ALK PHOS: 78 U/L (ref 38–126)
ALT: 15 U/L (ref 0–44)
ANION GAP: 10 (ref 5–15)
AST: 32 U/L (ref 15–41)
Albumin: 3.7 g/dL (ref 3.5–5.0)
BILIRUBIN TOTAL: 0.6 mg/dL (ref 0.3–1.2)
BUN: 38 mg/dL — ABNORMAL HIGH (ref 8–23)
CALCIUM: 9.4 mg/dL (ref 8.9–10.3)
CO2: 25 mmol/L (ref 22–32)
Chloride: 103 mmol/L (ref 98–111)
Creatinine, Ser: 1.19 mg/dL — ABNORMAL HIGH (ref 0.44–1.00)
GFR calc non Af Amer: 45 mL/min — ABNORMAL LOW (ref >60)
GFR, EST AFRICAN AMERICAN: 52 mL/min — AB (ref >60)
Glucose, Bld: 106 mg/dL — ABNORMAL HIGH (ref 70–99)
Potassium: 4.1 mmol/L (ref 3.5–5.1)
SODIUM: 138 mmol/L (ref 135–145)
TOTAL PROTEIN: 7.6 g/dL (ref 6.5–8.1)

## 2018-02-24 ENCOUNTER — Other Ambulatory Visit: Payer: Self-pay | Admitting: Internal Medicine

## 2018-02-24 DIAGNOSIS — I429 Cardiomyopathy, unspecified: Secondary | ICD-10-CM

## 2018-02-28 ENCOUNTER — Ambulatory Visit
Admission: RE | Admit: 2018-02-28 | Discharge: 2018-02-28 | Disposition: A | Discharge status: Home / Self Care | Payer: BLUE CROSS/BLUE SHIELD | Source: Ambulatory Visit | Attending: Internal Medicine | Admitting: Internal Medicine

## 2018-02-28 ENCOUNTER — Encounter: Payer: Self-pay | Admitting: Internal Medicine

## 2018-02-28 ENCOUNTER — Encounter
Admission: RE | Disposition: A | Discharge status: Home / Self Care | Payer: Self-pay | Source: Ambulatory Visit | Attending: Internal Medicine

## 2018-02-28 DIAGNOSIS — Z79890 Hormone replacement therapy (postmenopausal): Secondary | ICD-10-CM | POA: Insufficient documentation

## 2018-02-28 DIAGNOSIS — I2584 Coronary atherosclerosis due to calcified coronary lesion: Secondary | ICD-10-CM | POA: Insufficient documentation

## 2018-02-28 DIAGNOSIS — Z79899 Other long term (current) drug therapy: Secondary | ICD-10-CM | POA: Diagnosis not present

## 2018-02-28 DIAGNOSIS — R931 Abnormal findings on diagnostic imaging of heart and coronary circulation: Secondary | ICD-10-CM | POA: Insufficient documentation

## 2018-02-28 DIAGNOSIS — Z7982 Long term (current) use of aspirin: Secondary | ICD-10-CM | POA: Diagnosis not present

## 2018-02-28 DIAGNOSIS — I272 Pulmonary hypertension, unspecified: Secondary | ICD-10-CM | POA: Diagnosis not present

## 2018-02-28 DIAGNOSIS — E039 Hypothyroidism, unspecified: Secondary | ICD-10-CM | POA: Diagnosis not present

## 2018-02-28 DIAGNOSIS — Z853 Personal history of malignant neoplasm of breast: Secondary | ICD-10-CM | POA: Diagnosis not present

## 2018-02-28 DIAGNOSIS — Z7984 Long term (current) use of oral hypoglycemic drugs: Secondary | ICD-10-CM | POA: Diagnosis not present

## 2018-02-28 DIAGNOSIS — Z91018 Allergy to other foods: Secondary | ICD-10-CM | POA: Diagnosis not present

## 2018-02-28 DIAGNOSIS — Z9841 Cataract extraction status, right eye: Secondary | ICD-10-CM | POA: Diagnosis not present

## 2018-02-28 DIAGNOSIS — I5020 Unspecified systolic (congestive) heart failure: Secondary | ICD-10-CM | POA: Insufficient documentation

## 2018-02-28 DIAGNOSIS — E7800 Pure hypercholesterolemia, unspecified: Secondary | ICD-10-CM | POA: Insufficient documentation

## 2018-02-28 DIAGNOSIS — I110 Hypertensive heart disease with heart failure: Secondary | ICD-10-CM | POA: Insufficient documentation

## 2018-02-28 DIAGNOSIS — Z9889 Other specified postprocedural states: Secondary | ICD-10-CM | POA: Diagnosis not present

## 2018-02-28 DIAGNOSIS — I429 Cardiomyopathy, unspecified: Secondary | ICD-10-CM | POA: Diagnosis not present

## 2018-02-28 DIAGNOSIS — Z801 Family history of malignant neoplasm of trachea, bronchus and lung: Secondary | ICD-10-CM | POA: Insufficient documentation

## 2018-02-28 DIAGNOSIS — Z9842 Cataract extraction status, left eye: Secondary | ICD-10-CM | POA: Diagnosis not present

## 2018-02-28 DIAGNOSIS — E119 Type 2 diabetes mellitus without complications: Secondary | ICD-10-CM | POA: Diagnosis not present

## 2018-02-28 DIAGNOSIS — I2510 Atherosclerotic heart disease of native coronary artery without angina pectoris: Secondary | ICD-10-CM | POA: Insufficient documentation

## 2018-02-28 DIAGNOSIS — I5023 Acute on chronic systolic (congestive) heart failure: Secondary | ICD-10-CM

## 2018-02-28 DIAGNOSIS — R011 Cardiac murmur, unspecified: Secondary | ICD-10-CM | POA: Insufficient documentation

## 2018-02-28 DIAGNOSIS — I428 Other cardiomyopathies: Secondary | ICD-10-CM

## 2018-02-28 HISTORY — PX: RIGHT/LEFT HEART CATH AND CORONARY ANGIOGRAPHY: CATH118266

## 2018-02-28 SURGERY — RIGHT/LEFT HEART CATH AND CORONARY ANGIOGRAPHY
Anesthesia: Moderate Sedation | Laterality: Bilateral

## 2018-02-28 MED ORDER — ONDANSETRON HCL 4 MG/2ML IJ SOLN
4.0000 mg | Freq: Four times a day (QID) | INTRAMUSCULAR | Status: DC | PRN
Start: 2018-02-28 — End: 2018-02-28

## 2018-02-28 MED ORDER — SODIUM CHLORIDE 0.9% FLUSH: 3.0000 mL | INTRAVENOUS | Status: DC | PRN

## 2018-02-28 MED ORDER — SODIUM CHLORIDE 0.9 % IV SOLN
INTRAVENOUS | Status: DC
  Administered 2018-02-28: 08:00:00 via INTRAVENOUS

## 2018-02-28 MED ORDER — VERAPAMIL HCL 2.5 MG/ML IV SOLN
INTRAVENOUS | Status: DC | PRN
  Administered 2018-02-28: 2.5 mg via INTRA_ARTERIAL

## 2018-02-28 MED ORDER — HEPARIN SODIUM (PORCINE) 1000 UNIT/ML IJ SOLN
INTRAMUSCULAR | Status: DC | PRN
  Administered 2018-02-28: 3500 [IU] via INTRAVENOUS

## 2018-02-28 MED ORDER — VERAPAMIL HCL 2.5 MG/ML IV SOLN
INTRAVENOUS | Status: AC
  Filled 2018-02-28: qty 2

## 2018-02-28 MED ORDER — ASPIRIN 81 MG PO CHEW
CHEWABLE_TABLET | ORAL | Status: AC
  Filled 2018-02-28: qty 1

## 2018-02-28 MED ORDER — SODIUM CHLORIDE 0.9% FLUSH: 3.0000 mL | Freq: Two times a day (BID) | INTRAVENOUS | Status: DC

## 2018-02-28 MED ORDER — SODIUM CHLORIDE 0.9 % IV SOLN: 250.0000 mL | INTRAVENOUS | Status: DC | PRN

## 2018-02-28 MED ORDER — FENTANYL CITRATE (PF) 100 MCG/2ML IJ SOLN
INTRAMUSCULAR | Status: AC
  Filled 2018-02-28: qty 2

## 2018-02-28 MED ORDER — MIDAZOLAM HCL 2 MG/2ML IJ SOLN
INTRAMUSCULAR | Status: AC
  Filled 2018-02-28: qty 2

## 2018-02-28 MED ORDER — HEPARIN SODIUM (PORCINE) 1000 UNIT/ML IJ SOLN
INTRAMUSCULAR | Status: AC
  Filled 2018-02-28: qty 1

## 2018-02-28 MED ORDER — ACETAMINOPHEN 325 MG PO TABS: 650.0000 mg | ORAL_TABLET | ORAL | Status: DC | PRN

## 2018-02-28 MED ORDER — MIDAZOLAM HCL 2 MG/2ML IJ SOLN
INTRAMUSCULAR | Status: DC | PRN
  Administered 2018-02-28: 0.5 mg via INTRAVENOUS

## 2018-02-28 MED ORDER — FENTANYL CITRATE (PF) 100 MCG/2ML IJ SOLN
INTRAMUSCULAR | Status: DC | PRN
  Administered 2018-02-28: 25 ug via INTRAVENOUS

## 2018-02-28 MED ORDER — ASPIRIN 81 MG PO CHEW
81.0000 mg | CHEWABLE_TABLET | ORAL | Status: AC
  Administered 2018-02-28: 81 mg via ORAL

## 2018-02-28 MED ORDER — HEPARIN (PORCINE) IN NACL 1000-0.9 UT/500ML-% IV SOLN
INTRAVENOUS | Status: AC
  Filled 2018-02-28: qty 1000

## 2018-02-28 MED ORDER — FUROSEMIDE 40 MG PO TABS: 40.0000 mg | ORAL_TABLET | Freq: Every day | ORAL | 11 refills | Status: DC

## 2018-02-28 MED ORDER — LIDOCAINE HCL (PF) 1 % IJ SOLN
INTRAMUSCULAR | Status: AC
  Filled 2018-02-28: qty 30

## 2018-02-28 SURGICAL SUPPLY — 11 items
CATH BALLN WEDGE 5F 110CM (CATHETERS) ×3 IMPLANT
CATH INFINITI 5FR ANG PIGTAIL (CATHETERS) ×3 IMPLANT
CATH INFINITI 5FR TG (CATHETERS) ×3 IMPLANT
DEVICE RAD TR BAND REGULAR (VASCULAR PRODUCTS) ×3 IMPLANT
KIT MANI 3VAL PERCEP (MISCELLANEOUS) ×3 IMPLANT
KIT RIGHT HEART (MISCELLANEOUS) ×3 IMPLANT
NEEDLE PERC 21GX4CM (NEEDLE) ×3 IMPLANT
PACK CARDIAC CATH (CUSTOM PROCEDURE TRAY) ×3 IMPLANT
SHEATH RAIN 4/5FR (SHEATH) ×3 IMPLANT
SHEATH RAIN RADIAL 21G 6FR (SHEATH) ×3 IMPLANT
WIRE ROSEN-J .035X260CM (WIRE) ×3 IMPLANT

## 2018-02-28 NOTE — Interval H&P Note (Signed)
History and Physical Interval Note:  02/28/2018 9:06 AM  Ebony Tuel  has presented today for cardiac catheterization, with the diagnosis of cardiomyopathy with new systolic heart failure. The various methods of treatment have been discussed with the patient and family. After consideration of risks, benefits and other options for treatment, the patient has consented to  Procedure(s): RIGHT/LEFT HEART CATH AND CORONARY ANGIOGRAPHY (Bilateral) as a surgical intervention .  The patient's history has been reviewed, patient examined, no change in status, stable for surgery.  I have reviewed the patient's chart and labs.  Questions were answered to the patient's satisfaction.    Cath Lab Visit (complete for each Cath Lab visit)  Clinical Evaluation Leading to the Procedure:   ACS: No.  Non-ACS:    Anginal Classification: CCS I  Anti-ischemic medical therapy: Minimal Therapy (1 class of medications)  Non-Invasive Test Results: No non-invasive testing performed; LVEF 35-40% by echo.  Prior CABG: No previous CABG  Lyndell Gillyard

## 2018-03-01 ENCOUNTER — Telehealth: Payer: Self-pay | Admitting: *Deleted

## 2018-03-01 DIAGNOSIS — I5022 Chronic systolic (congestive) heart failure: Secondary | ICD-10-CM

## 2018-03-01 NOTE — Telephone Encounter (Signed)
-----   Message from Nelva Bush, MD sent at 03/01/2018  9:27 AM EDT ----- Regarding: Post-cath f/u Hi [name],  Can you arrange for Ms. Stacy Newman to have a basic metabolic panel in about a week for chronic systolic heart failure and follow-up with me or an APP in about 2 weeks following her catheterization yesterday?  Please let me know if any questions or concerns arise.  Thanks.  Gerald Stabs

## 2018-03-01 NOTE — Telephone Encounter (Signed)
Pt called back She stated she would go to [facility] to have labs drawn on 03/08/18  She also states she would call back to confirm 2 week follow up with Dr End on 03/15/18 at 11 am  She verbalized understanding with no further questions.

## 2018-03-01 NOTE — Telephone Encounter (Signed)
No answer. Left message to call back.   

## 2018-03-08 ENCOUNTER — Other Ambulatory Visit
Admission: RE | Admit: 2018-03-08 | Discharge: 2018-03-08 | Disposition: A | Discharge status: Home / Self Care | Payer: BLUE CROSS/BLUE SHIELD | Source: Ambulatory Visit | Attending: Internal Medicine | Admitting: Internal Medicine

## 2018-03-08 DIAGNOSIS — I5022 Chronic systolic (congestive) heart failure: Secondary | ICD-10-CM

## 2018-03-08 LAB — BASIC METABOLIC PANEL
Anion gap: 7 (ref 5–15)
BUN: 42 mg/dL — ABNORMAL HIGH (ref 8–23)
CO2: 29 mmol/L (ref 22–32)
Calcium: 9.3 mg/dL (ref 8.9–10.3)
Chloride: 102 mmol/L (ref 98–111)
Creatinine, Ser: 1 mg/dL (ref 0.44–1.00)
GFR calc Af Amer: >60 mL/min (ref >60)
GFR calc non Af Amer: 56 mL/min — ABNORMAL LOW (ref >60)
Glucose, Bld: 92 mg/dL (ref 70–99)
Potassium: 4.8 mmol/L (ref 3.5–5.1)
Sodium: 138 mmol/L (ref 135–145)

## 2018-03-15 ENCOUNTER — Encounter: Payer: Self-pay | Admitting: Internal Medicine

## 2018-03-15 ENCOUNTER — Ambulatory Visit (INDEPENDENT_AMBULATORY_CARE_PROVIDER_SITE_OTHER): Payer: BLUE CROSS/BLUE SHIELD | Admitting: Internal Medicine

## 2018-03-15 VITALS — BP 118/68 | HR 78 | Ht 64.0 in | Wt 161.5 lb

## 2018-03-15 DIAGNOSIS — I429 Cardiomyopathy, unspecified: Secondary | ICD-10-CM

## 2018-03-15 DIAGNOSIS — I5020 Unspecified systolic (congestive) heart failure: Secondary | ICD-10-CM

## 2018-03-15 MED ORDER — LISINOPRIL 20 MG PO TABS: 20.0000 mg | ORAL_TABLET | Freq: Every day | ORAL | 3 refills | Status: DC

## 2018-03-15 NOTE — Patient Instructions (Signed)
Medication Instructions:  Your physician has recommended you make the following change in your medication:  1- INCREASE Lisinopril to 20 mg by mouth once a day.   Labwork: Your physician recommends that you return for lab work in: [place] (BMET). ON AUGUST 14TH AT [place] [facility]. Please go to the [facility]. You will check in at the front desk to the right as you walk into the atrium. Valet Parking is offered if needed.    Testing/Procedures: NONE  Follow-Up: Your physician recommends that you schedule a follow-up appointment in: 3 MONTHS WITH DR END.   If you need a refill on your cardiac medications before your next appointment, please call your pharmacy.

## 2018-03-15 NOTE — Progress Notes (Signed)
Follow-up Outpatient Visit Date: 03/15/2018  Primary Care Provider: Lonn Georgia, [place] [address]  Chief Complaint: Follow-up heart failure  HPI:  Stacy Newman is a 72 y.o. year-old female with history of recently diagnosed heart failure secondary to nonischemic cardiomyopathy, hypertension, hyperlipidemia, type 2 diabetes mellitus, hypothyroidism, and remote left breast cancer status post lumpectomy and chemoradiation, who presents for follow-up of cardiomyopathy.  I met her on 02/22/2018, which time she was not symptomatic.  However, she had experienced persistent cough and transient shortness of breath over the preceding few months.  Given moderately reduced LVEF by echo, we proceeded with a left and right heart catheterization the following week, which revealed nonobstructive CAD and mildly to moderately elevated left and right heart filling pressures as well as reduced cardiac output/index.  Furosemide was added at that time.  Today, Stacy Newman reports that she is feeling much better.  Her shortness of breath (which she previously did not endorse) has improved with furosemide.  She denies chest pain, palpitations, lightheadedness, and edema.  She reports an intermittent cough when drinking something cold.  This is not present when she drinks room-temperature or hot liquids.  Home BP's are typically in the 120's/60's.  She has not had any problems with her right arm catheterization sites.  --------------------------------------------------------------------------------------------------  Cardiovascular History & Procedures: Cardiovascular Problems:  Nonischemic cardiomyopathy  Nonobstructive coronary artery disease  Risk Factors:  Hypertension, hyperlipidemia, diabetes mellitus, and age greater than 27  Cath/PCI:  LHC/RHC (02/28/2018): LMCA normal.  LAD with 20% mid vessel stenosis.  A large D1 with 40% proximal stenosis.  LCx with 20 to 30%  proximal stenosis.  Nondominant RCA without significant disease.  LVEDP 32 mmHg.  RA 8, RV 55/12, PA 53/20 (35), and PCWP 20 mmHg.  Ao sat 97%.  PA sat 67%.  Fick CO/CI 3.3/1.9.  PVR 4.5 WU.  CV Surgery:  None  EP Procedures and Devices:  None  Non-Invasive Evaluation(s):  Echocardiogram (02/21/2018): LV size at upper limits of normal with normal wall thickness.  LVEF 35 to 40% with diffuse hypokinesis and severe hypokinesis of the anterior and anteroseptal myocardium.  Grade 1 diastolic dysfunction with elevated filling pressures.  Mild calcified aortic valve annulus without stenosis or regurgitation.  Mild MR.  Normal RV size and function.  Elevated central venous pressure with blunted respirophasic changes of the IVC.  Recent CV Pertinent Labs: Lab Results  Component Value Date   K 4.8 03/08/2018   BUN 42 (H) 03/08/2018   CREATININE 1.00 03/08/2018    Past medical and surgical history were reviewed and updated in EPIC.  Current Meds  Medication Sig  . aspirin 81 MG tablet Take 81 mg by mouth daily. LUNCH  . carvedilol (COREG) 6.25 MG tablet Take 1 tablet (6.25 mg total) by mouth 2 (two) times daily.  . Cyanocobalamin (VITAMIN B12) 3000 MCG/ML LIQD Place 1 Dose under the tongue.  . furosemide (LASIX) 40 MG tablet Take 1 tablet (40 mg total) by mouth daily.  Marland Kitchen glipiZIDE (GLUCOTROL) 5 MG tablet Take by mouth daily. BEDTIME  . levothyroxine (SYNTHROID, LEVOTHROID) 50 MCG tablet Take 50 mcg by mouth daily before breakfast.  . metFORMIN (GLUCOPHAGE) 500 MG tablet Take 500 mg by mouth daily. LUNCH  . Multiple Vitamins-Minerals (MULTIVITAL PO) Take 1 tablet by mouth every other day.  . potassium chloride (K-DUR) 10 MEQ tablet Take 10 mEq by mouth daily. LUNCH  . simvastatin (ZOCOR) 40 MG tablet Take 40 mg  by mouth daily. Reported on 12/29/2015  . Vitamin D, Ergocalciferol, (DRISDOL) 50000 UNITS CAPS capsule Take 50,000 Units by mouth every 7 (seven) days.  . [DISCONTINUED] lisinopril  (PRINIVIL,ZESTRIL) 10 MG tablet Take 10 mg by mouth daily. BREAKSFAS    Allergies: Food color red [red dye]  Social History   Tobacco Use  . Smoking status: Never Smoker  . Smokeless tobacco: Never Used  Substance Use Topics  . Alcohol use: No  . Drug use: Never    Family History  Problem Relation Age of Onset  . Alzheimer's disease Mother   . Lung cancer Father     Review of Systems: A 12-system review of systems was performed and was negative except as noted in the HPI.  --------------------------------------------------------------------------------------------------  Physical Exam: BP 118/68 (BP Location: Left Arm, Patient Position: Sitting, Cuff Size: Normal)   Pulse 78   Ht 5' 4"  (1.626 m)   Wt 161 lb 8 oz (73.3 kg)   BMI 27.72 kg/m   General:  NAD. HEENT: No conjunctival pallor or scleral icterus. Moist mucous membranes.  OP clear. Neck: Supple without lymphadenopathy, thyromegaly, JVD, or HJR. No carotid bruit. Lungs: Normal work of breathing. Clear to auscultation bilaterally without wheezes or crackles. Heart: Regular rate and rhythm without murmurs, rubs, or gallops. Non-displaced PMI. Abd: Bowel sounds present. Soft, NT/ND without hepatosplenomegaly Ext: No lower extremity edema. Radial, PT, and DP pulses are 2+ bilaterally.  Right radial arteriotomy site is well-healed. Skin: Warm and dry without rash.  EKG:  NSR with non-specific T-wave abnormality.  No significant change from prior tracing on 02/22/18.  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  She appears euvolemic on exam today with NYHA class I-II symptoms.  We will continue furosemide 40 mg daily.  We will increase lisinopril to 20 mg daily to optimize evidence-based HF therapy.  If intermittent cough persists (though atypical given only present with cold beverages), we would need to consider switching to an ARB.  We will continue current dose of carvedilol.  We will recheck a BMP in ~2 weeks.  Follow-up: Return to clinic in 3 months.  Nelva Bush, MD 03/16/2018 1:18 PM

## 2018-03-16 ENCOUNTER — Encounter: Payer: Self-pay | Admitting: Internal Medicine

## 2018-03-29 ENCOUNTER — Telehealth: Payer: Self-pay | Admitting: Internal Medicine

## 2018-03-29 ENCOUNTER — Other Ambulatory Visit
Admission: RE | Admit: 2018-03-29 | Discharge: 2018-03-29 | Disposition: A | Discharge status: Home / Self Care | Payer: BLUE CROSS/BLUE SHIELD | Source: Ambulatory Visit | Attending: Internal Medicine | Admitting: Internal Medicine

## 2018-03-29 DIAGNOSIS — I429 Cardiomyopathy, unspecified: Secondary | ICD-10-CM

## 2018-03-29 DIAGNOSIS — I5020 Unspecified systolic (congestive) heart failure: Secondary | ICD-10-CM | POA: Diagnosis present

## 2018-03-29 DIAGNOSIS — Z79899 Other long term (current) drug therapy: Secondary | ICD-10-CM

## 2018-03-29 LAB — BASIC METABOLIC PANEL
Anion gap: 12 (ref 5–15)
BUN: 70 mg/dL — ABNORMAL HIGH (ref 8–23)
CO2: 26 mmol/L (ref 22–32)
CREATININE: 2.3 mg/dL — AB (ref 0.44–1.00)
Calcium: 9.3 mg/dL (ref 8.9–10.3)
Chloride: 97 mmol/L — ABNORMAL LOW (ref 98–111)
GFR calc Af Amer: 24 mL/min — ABNORMAL LOW (ref >60)
GFR calc non Af Amer: 20 mL/min — ABNORMAL LOW (ref >60)
GLUCOSE: 87 mg/dL (ref 70–99)
Potassium: 4.8 mmol/L (ref 3.5–5.1)
Sodium: 135 mmol/L (ref 135–145)

## 2018-03-29 NOTE — Telephone Encounter (Signed)
BMP obtained today reviewed with the patient by phone, including evidence of AKI with significant elevation in BUN and creatinine from baseline.  Patient reports feeling well.  I have instructed to stop taking furosemide, lisinopril, and metformin at this time.  I will have our office make arrangements for repeat BMP in 2 days.  In the meantime, I have encouraged Stacy Newman to increase her water intake and to see immediate medical attention if any new symptoms develop.  She is in agreement with this plan.  Nelva Bush, MD [facility] Pager: [phone]

## 2018-03-30 NOTE — Telephone Encounter (Signed)
S/w patient. She verbalized understanding to go to the [facility] as early in the day as she can for repeat BMP. Order entered.

## 2018-03-30 NOTE — Telephone Encounter (Signed)
No answer. Left message to call back.   

## 2018-03-30 NOTE — Telephone Encounter (Signed)
Pt is returning your call

## 2018-03-31 ENCOUNTER — Other Ambulatory Visit: Payer: Self-pay | Admitting: *Deleted

## 2018-03-31 ENCOUNTER — Other Ambulatory Visit
Admission: RE | Admit: 2018-03-31 | Discharge: 2018-03-31 | Disposition: A | Discharge status: Home / Self Care | Payer: BLUE CROSS/BLUE SHIELD | Source: Ambulatory Visit | Attending: Internal Medicine | Admitting: Internal Medicine

## 2018-03-31 DIAGNOSIS — I5020 Unspecified systolic (congestive) heart failure: Secondary | ICD-10-CM

## 2018-03-31 DIAGNOSIS — Z79899 Other long term (current) drug therapy: Secondary | ICD-10-CM | POA: Diagnosis present

## 2018-03-31 DIAGNOSIS — I429 Cardiomyopathy, unspecified: Secondary | ICD-10-CM | POA: Insufficient documentation

## 2018-03-31 LAB — BASIC METABOLIC PANEL
ANION GAP: 9 (ref 5–15)
BUN: 54 mg/dL — ABNORMAL HIGH (ref 8–23)
CHLORIDE: 103 mmol/L (ref 98–111)
CO2: 25 mmol/L (ref 22–32)
Calcium: 9.3 mg/dL (ref 8.9–10.3)
Creatinine, Ser: 1.19 mg/dL — ABNORMAL HIGH (ref 0.44–1.00)
GFR calc Af Amer: 52 mL/min — ABNORMAL LOW (ref >60)
GFR calc non Af Amer: 45 mL/min — ABNORMAL LOW (ref >60)
GLUCOSE: 81 mg/dL (ref 70–99)
POTASSIUM: 4.3 mmol/L (ref 3.5–5.1)
Sodium: 137 mmol/L (ref 135–145)

## 2018-03-31 MED ORDER — LISINOPRIL 10 MG PO TABS: 10.0000 mg | ORAL_TABLET | Freq: Every day | ORAL | 3 refills | Status: DC

## 2018-03-31 MED ORDER — FUROSEMIDE 40 MG PO TABS: 40.0000 mg | ORAL_TABLET | Freq: Every day | ORAL | 11 refills | Status: DC | PRN

## 2018-04-06 ENCOUNTER — Other Ambulatory Visit
Admission: RE | Admit: 2018-04-06 | Discharge: 2018-04-06 | Disposition: A | Discharge status: Home / Self Care | Payer: BLUE CROSS/BLUE SHIELD | Source: Ambulatory Visit | Attending: Internal Medicine | Admitting: Internal Medicine

## 2018-04-06 DIAGNOSIS — I429 Cardiomyopathy, unspecified: Secondary | ICD-10-CM | POA: Diagnosis present

## 2018-04-06 DIAGNOSIS — I5020 Unspecified systolic (congestive) heart failure: Secondary | ICD-10-CM | POA: Insufficient documentation

## 2018-04-06 DIAGNOSIS — Z79899 Other long term (current) drug therapy: Secondary | ICD-10-CM | POA: Insufficient documentation

## 2018-04-06 LAB — BASIC METABOLIC PANEL
Anion gap: 9 (ref 5–15)
BUN: 33 mg/dL — AB (ref 8–23)
CHLORIDE: 101 mmol/L (ref 98–111)
CO2: 25 mmol/L (ref 22–32)
CREATININE: 0.99 mg/dL (ref 0.44–1.00)
Calcium: 9.3 mg/dL (ref 8.9–10.3)
GFR calc Af Amer: >60 mL/min (ref >60)
GFR calc non Af Amer: 56 mL/min — ABNORMAL LOW (ref >60)
Glucose, Bld: 74 mg/dL (ref 70–99)
POTASSIUM: 4.2 mmol/L (ref 3.5–5.1)
SODIUM: 135 mmol/L (ref 135–145)

## 2018-04-10 ENCOUNTER — Ambulatory Visit: Payer: BLUE CROSS/BLUE SHIELD | Admitting: Physician Assistant

## 2018-06-05 NOTE — Progress Notes (Signed)
Cardiology Office Note Date:  06/06/2018  Patient ID:  Stacy Newman, Stacy Newman 11-Mar-1946, MRN [id number] PCP:  Lonn Georgia, [place]  Cardiologist:  Dr. Saunders Revel, MD    Chief Complaint: Follow up  History of Present Illness: Stacy Newman is a 72 y.o. female with history of HFrEF secondary to NICM, DM2, HTN, HLD, hypothyroidism, and remote left breast cancer s/p lumpectomy and chemoradiation who presents for follow up of her CHF.   Patient was evaluated in 02/2018 with a persistent cough and transient SOB over the preceding several months. At that time, echo on 7/9/19showed an EF of 35-40% diffuse hypokinesis with severe hypokinesis of the anterior and anteroseptal myocardium, Gr1DD, mildly calcified aortic valve without stenosis or regurgitation, mild MR, normal RVSF, elevated CVP. Given her newly found cardiomyopathy, she underwent R/LHC on 02/28/18 that showed nonobstructive CAD with LMCA normal, mid LAD 20% stenosis, large D1 with proximal 40% stenosis, proximal LCx 20-30% stenosis nondominant RCA with no significant disease. Mildly to moderately elevated left and right heart filling pressures as well as a reduced cardiac output/index of 3.3/1.9. She has been maintained on Coreg, lisinopril, and Lasix. She was most recently seen on 03/15/18 in follow up and was feeling much better with improvement in her SOB following the addition of Lasix. She did note an intermittent cough, primarily with cold beverages. Follow up bmet on 03/29/18 showed a worsening in her renal function with a BUN of 70 and a SCr of 2.30 (prior BUN/SCr 42/1 from 03/08/18). She was advised to stop lisinopril, Lasix, and metformin and increase her water intake. Recheck bmet on 03/31/18 showed improvement in her BUN/SCr to 54/1.19. She was advised to restart metformin at prior dose and lisinopril at 10 mg as well as Lasix to be used only prn for swelling or weight gain. Recheck bmet on 04/06/18 showed BUN/SCr 33/0.99. No changes in her  medications were needed at that time.   She comes in doing well today. She feels much better than when she was last seen. Her cough has resolved without intervention. She is tolerating all medications without issues. She tries to limit her sodium intake and does not eat out at restaurants often. She does snack on chips about 3 times per week. She is drinking more than 2 L of fluids daily. Her weight is up 7 pounds today when compared to her 7/31 office visit, though she does not feel like this is fluid and is more so related to weight gain. She has to take her prn Lasix ~ 3 times weekly. No lower extremity swelling, abdominal distension, orthopnea, or early satiety.    Past Medical History:  Diagnosis Date  . Cancer (HCC)    BREAST  . Diabetes mellitus without complication (HCC)    TYPE 11  . Hypercholesterolemia   . Hypertension   . Hypothyroidism     Past Surgical History:  Procedure Laterality Date  . BREAST SURGERY  2001   LUMPECTOMY LEFT SIDE  . CATARACT EXTRACTION W/PHACO Right 03/31/2015   Procedure: CATARACT EXTRACTION PHACO AND INTRAOCULAR LENS PLACEMENT (IOC);  Surgeon: Ronnell Freshwater, MD;  Location: [facility];  Service: Ophthalmology;  Laterality: Right;  DIABETIC-ORAL MEDS  . CATARACT EXTRACTION W/PHACO Left 12/29/2015   Procedure: CATARACT EXTRACTION PHACO AND INTRAOCULAR LENS PLACEMENT (IOC);  Surgeon: Ronnell Freshwater, MD;  Location: [facility];  Service: Ophthalmology;  Laterality: Left;  DIABETIC - oral meds LEAVE PT AT 830 ARRIVAL TIME  . RIGHT/LEFT HEART CATH AND CORONARY ANGIOGRAPHY  Bilateral 02/28/2018   Procedure: RIGHT/LEFT HEART CATH AND CORONARY ANGIOGRAPHY;  Surgeon: Nelva Bush, MD;  Location: [facility];  Service: Cardiovascular;  Laterality: Bilateral;    Current Meds  Medication Sig  . aspirin 81 MG tablet Take 81 mg by mouth daily. LUNCH  . carvedilol (COREG) 6.25 MG tablet Take 1 tablet (6.25 mg total) by  mouth 2 (two) times daily.  . Cyanocobalamin (VITAMIN B12) 3000 MCG/ML LIQD Place 1 Dose under the tongue.  . furosemide (LASIX) 40 MG tablet Take 1 tablet (40 mg total) by mouth daily as needed. For swelling or weight gain.  Marland Kitchen glipiZIDE (GLUCOTROL) 5 MG tablet Take by mouth daily. BEDTIME  . levothyroxine (SYNTHROID, LEVOTHROID) 50 MCG tablet Take 50 mcg by mouth daily before breakfast.  . lisinopril (PRINIVIL,ZESTRIL) 10 MG tablet Take 1 tablet (10 mg total) by mouth daily.  . metFORMIN (GLUCOPHAGE) 500 MG tablet Take 500 mg by mouth daily. LUNCH  . Multiple Vitamins-Minerals (MULTIVITAL PO) Take 1 tablet by mouth every other day.  . potassium chloride (K-DUR) 10 MEQ tablet Take 10 mEq by mouth daily. LUNCH  . simvastatin (ZOCOR) 40 MG tablet Take 40 mg by mouth daily. Reported on 12/29/2015  . Vitamin D, Ergocalciferol, (DRISDOL) 50000 UNITS CAPS capsule Take 50,000 Units by mouth every 7 (seven) days.    Allergies:   Food color red [red dye]   Social History:  The patient  reports that she has never smoked. She has never used smokeless tobacco. She reports that she does not drink alcohol or use drugs.   Family History:  The patient's family history includes Alzheimer's disease in her mother; Lung cancer in her father.  ROS:   Review of Systems  Constitutional: Negative for chills, diaphoresis, fever, malaise/fatigue and weight loss.  HENT: Negative for congestion.   Eyes: Negative for discharge and redness.  Respiratory: Negative for cough, hemoptysis, sputum production, shortness of breath and wheezing.   Cardiovascular: Negative for chest pain, palpitations, orthopnea, claudication, leg swelling and PND.  Gastrointestinal: Negative for abdominal pain, blood in stool, heartburn, melena, nausea and vomiting.  Genitourinary: Negative for hematuria.  Musculoskeletal: Negative for falls and myalgias.  Skin: Negative for rash.  Neurological: Negative for dizziness, tingling, tremors,  sensory change, speech change, focal weakness, loss of consciousness and weakness.  Endo/Heme/Allergies: Does not bruise/bleed easily.  Psychiatric/Behavioral: Negative for substance abuse. The patient is not nervous/anxious.   All other systems reviewed and are negative.    PHYSICAL EXAM:  VS:  BP (!) 142/72 (BP Location: Left Arm, Patient Position: Sitting, Cuff Size: Normal)   Pulse 92   Ht 5\' 4"  (1.626 m)   Wt 168 lb 5 oz (76.3 kg)   BMI 28.89 kg/m  BMI: Body mass index is 28.89 kg/m.  Physical Exam  Constitutional: She is oriented to person, place, and time. She appears well-developed and well-nourished.  HENT:  Head: Normocephalic and atraumatic.  Eyes: Right eye exhibits no discharge. Left eye exhibits no discharge.  Neck: Normal range of motion. No JVD present.  Cardiovascular: Normal rate, regular rhythm, S1 normal, S2 normal and normal heart sounds. Exam reveals no distant heart sounds, no friction rub, no midsystolic click and no opening snap.  No murmur heard. Pulses:      Posterior tibial pulses are 2+ on the right side, and 2+ on the left side.  Pulmonary/Chest: Effort normal and breath sounds normal. No respiratory distress. She has no decreased breath sounds. She has no wheezes.  She has no rales. She exhibits no tenderness.  Abdominal: Soft. She exhibits no distension. There is no tenderness.  Musculoskeletal: She exhibits no edema.  Neurological: She is alert and oriented to person, place, and time.  Skin: Skin is warm and dry. No cyanosis. Nails show no clubbing.  Psychiatric: She has a normal mood and affect. Her speech is normal and behavior is normal. Judgment and thought content normal.     EKG:  Was ordered and interpreted by me today. Shows NSR, 92 bpm, occasional PACs, nonspecific st/t changes  Recent Labs: 02/23/2018: ALT 15; Hemoglobin 10.9; Platelets 265 04/06/2018: BUN 33; Creatinine, Ser 0.99; Potassium 4.2; Sodium 135  No results found for requested  labs within last 8760 hours.   CrCl cannot be calculated (Patient's most recent lab result is older than the maximum 21 days allowed.).   Wt Readings from Last 3 Encounters:  06/06/18 168 lb 5 oz (76.3 kg)  03/15/18 161 lb 8 oz (73.3 kg)  02/28/18 157 lb (71.2 kg)     Other studies reviewed: Additional studies/records reviewed today include: summarized above  ASSESSMENT AND PLAN:  1. Chronic combined CHF secondary to NICM: She is doing quite well with NYHA class I to possibly II symptoms. Continue Coreg, lisinopril, and Lasix prn. Consider adding spironolactone in follow up. I will defer starting this at this time given her history of ARF recently. Check bmet. CHF education discussed.   2. HTN: Blood pressure is mildly elevated today. She reports eating an egg roll for lunch today. BP is typically 120s/70s. Continue current medications as above. Call if up-trending BP becomes persistent.   3. HLD: Simvastatin. Managed by PCP.   4. Cough: Resolved.   Disposition: F/u with Dr. Saunders Revel or an APP in 6 months, sooner if needed.   Current medicines are reviewed at length with the patient today.  The patient did not have any concerns regarding medicines.  Signed, Christell Faith, PA-C 06/06/2018 3:05 PM     [address] [place], [place] [address] [phone]

## 2018-06-06 ENCOUNTER — Encounter

## 2018-06-06 ENCOUNTER — Encounter: Payer: Self-pay | Admitting: Physician Assistant

## 2018-06-06 ENCOUNTER — Ambulatory Visit (INDEPENDENT_AMBULATORY_CARE_PROVIDER_SITE_OTHER): Payer: BLUE CROSS/BLUE SHIELD | Admitting: Physician Assistant

## 2018-06-06 VITALS — BP 142/72 | HR 92 | Ht 64.0 in | Wt 168.3 lb

## 2018-06-06 DIAGNOSIS — R05 Cough: Secondary | ICD-10-CM

## 2018-06-06 DIAGNOSIS — I5022 Chronic systolic (congestive) heart failure: Secondary | ICD-10-CM

## 2018-06-06 DIAGNOSIS — I10 Essential (primary) hypertension: Secondary | ICD-10-CM | POA: Diagnosis not present

## 2018-06-06 DIAGNOSIS — E782 Mixed hyperlipidemia: Secondary | ICD-10-CM

## 2018-06-06 DIAGNOSIS — I428 Other cardiomyopathies: Secondary | ICD-10-CM | POA: Diagnosis not present

## 2018-06-06 DIAGNOSIS — R059 Cough, unspecified: Secondary | ICD-10-CM

## 2018-06-06 NOTE — Patient Instructions (Signed)
Medication Instructions:  - Your physician recommends that you continue on your current medications as directed. Please refer to the Current Medication list given to you today.  If you need a refill on your cardiac medications before your next appointment, please call your pharmacy.   Lab work: - Your physician recommends that you have lab work today: BMP  If you have labs (blood work) drawn today and your tests are completely normal, you will receive your results only by: Marland Kitchen MyChart Message (if you have MyChart) OR . A paper copy in the mail If you have any lab test that is abnormal or we need to change your treatment, we will call you to review the results.  Testing/Procedures: - none ordered  Follow-Up: At [facility], you and your health needs are our priority.  As part of our continuing mission to provide you with exceptional heart care, we have created designated Provider Care Teams.  These Care Teams include your primary Cardiologist (physician) and Advanced Practice Providers (APPs -  Physician Assistants and Nurse Practitioners) who all work together to provide you with the care you need, when you need it. You will need a follow up appointment in 6 months.  Please call our office 2 months in advance to schedule this appointment.  You may see Dr. Harrell Gave End or one of the following Advanced Practice Providers on your designated Care Team:   Murray Hodgkins, NP Christell Faith, PA-C . Marrianne Mood, PA-C  Any Other Special Instructions Will Be Listed Below (If Applicable). - N/A

## 2018-06-07 ENCOUNTER — Telehealth: Payer: Self-pay | Admitting: *Deleted

## 2018-06-07 DIAGNOSIS — Z79899 Other long term (current) drug therapy: Secondary | ICD-10-CM

## 2018-06-07 DIAGNOSIS — I5020 Unspecified systolic (congestive) heart failure: Secondary | ICD-10-CM

## 2018-06-07 LAB — BASIC METABOLIC PANEL
BUN/Creatinine Ratio: 31 — ABNORMAL HIGH (ref 12–28)
BUN: 36 mg/dL — ABNORMAL HIGH (ref 8–27)
CO2: 22 mmol/L (ref 20–29)
Calcium: 9.4 mg/dL (ref 8.7–10.3)
Chloride: 103 mmol/L (ref 96–106)
Creatinine, Ser: 1.15 mg/dL — ABNORMAL HIGH (ref 0.57–1.00)
GFR calc Af Amer: 56 mL/min/{1.73_m2} — ABNORMAL LOW (ref >59)
GFR calc non Af Amer: 48 mL/min/{1.73_m2} — ABNORMAL LOW (ref >59)
GLUCOSE: 125 mg/dL — AB (ref 65–99)
Potassium: 4.3 mmol/L (ref 3.5–5.2)
SODIUM: 141 mmol/L (ref 134–144)

## 2018-06-07 NOTE — Telephone Encounter (Signed)
Results called to pt. Pt verbalized understanding of results and plan of care to go to [place] in 1 week for repeat BMET. Lab order entered.

## 2018-06-07 NOTE — Telephone Encounter (Signed)
No answer. Left message to call back.   

## 2018-06-07 NOTE — Telephone Encounter (Signed)
-----   Message from Rise Mu, PA-C sent at 06/07/2018  7:14 AM EDT ----- Renal function is mildly elevated from her 8/22 check, though relatively stable. Continue with current medications, including prn Lasix for weight gain > 3 pounds overnight, > 5 pounds in 1 week or SOB. I would like to recheck a bmet in 1 week to ensure stability.

## 2018-06-14 ENCOUNTER — Other Ambulatory Visit
Admission: RE | Admit: 2018-06-14 | Discharge: 2018-06-14 | Disposition: A | Discharge status: Home / Self Care | Payer: BLUE CROSS/BLUE SHIELD | Source: Ambulatory Visit | Attending: Physician Assistant | Admitting: Physician Assistant

## 2018-06-14 ENCOUNTER — Ambulatory Visit: Payer: BLUE CROSS/BLUE SHIELD | Admitting: Internal Medicine

## 2018-06-14 DIAGNOSIS — Z79899 Other long term (current) drug therapy: Secondary | ICD-10-CM | POA: Diagnosis present

## 2018-06-14 DIAGNOSIS — I5020 Unspecified systolic (congestive) heart failure: Secondary | ICD-10-CM | POA: Insufficient documentation

## 2018-06-14 LAB — BASIC METABOLIC PANEL
ANION GAP: 7 (ref 5–15)
BUN: 32 mg/dL — ABNORMAL HIGH (ref 8–23)
CO2: 26 mmol/L (ref 22–32)
Calcium: 9.4 mg/dL (ref 8.9–10.3)
Chloride: 104 mmol/L (ref 98–111)
Creatinine, Ser: 0.94 mg/dL (ref 0.44–1.00)
GFR calc Af Amer: >60 mL/min (ref >60)
GFR calc non Af Amer: >60 mL/min (ref >60)
GLUCOSE: 87 mg/dL (ref 70–99)
POTASSIUM: 4.2 mmol/L (ref 3.5–5.1)
Sodium: 137 mmol/L (ref 135–145)

## 2018-11-22 ENCOUNTER — Telehealth: Payer: Self-pay | Admitting: *Deleted

## 2018-11-22 NOTE — Telephone Encounter (Signed)

## 2018-11-23 ENCOUNTER — Other Ambulatory Visit: Payer: Self-pay

## 2018-11-23 ENCOUNTER — Encounter: Payer: Self-pay | Admitting: Internal Medicine

## 2018-11-23 ENCOUNTER — Telehealth (INDEPENDENT_AMBULATORY_CARE_PROVIDER_SITE_OTHER): Payer: BLUE CROSS/BLUE SHIELD | Admitting: Internal Medicine

## 2018-11-23 VITALS — BP 112/70 | HR 84 | Ht 64.0 in | Wt 163.0 lb

## 2018-11-23 DIAGNOSIS — I428 Other cardiomyopathies: Secondary | ICD-10-CM

## 2018-11-23 DIAGNOSIS — I2510 Atherosclerotic heart disease of native coronary artery without angina pectoris: Secondary | ICD-10-CM | POA: Diagnosis not present

## 2018-11-23 DIAGNOSIS — I5042 Chronic combined systolic (congestive) and diastolic (congestive) heart failure: Secondary | ICD-10-CM | POA: Insufficient documentation

## 2018-11-23 DIAGNOSIS — I10 Essential (primary) hypertension: Secondary | ICD-10-CM | POA: Insufficient documentation

## 2018-11-23 DIAGNOSIS — E785 Hyperlipidemia, unspecified: Secondary | ICD-10-CM | POA: Insufficient documentation

## 2018-11-23 DIAGNOSIS — I5022 Chronic systolic (congestive) heart failure: Secondary | ICD-10-CM | POA: Insufficient documentation

## 2018-11-23 NOTE — Progress Notes (Signed)
Virtual Visit via Telephone Note   This visit type was conducted due to national recommendations for restrictions regarding the COVID-19 Pandemic (e.g. social distancing) in an effort to limit this patient's exposure and mitigate transmission in our community.  Due to her co-morbid illnesses, this patient is at least at moderate risk for complications without adequate follow up.  This format is felt to be most appropriate for this patient at this time.  The patient did not have access to video technology/had technical difficulties with video requiring transitioning to audio format only (telephone).  All issues noted in this document were discussed and addressed.  No physical exam could be performed with this format.  Verbal consent was obtained from the patient.  Evaluation Performed:  Follow-up visit  Date:  11/23/2018   ID:  Stacy Newman, DOB 02/24/1946, MRN [id number]  Patient Location: Home  Provider Location: Home  PCP:  Lonn Georgia, FNP  Cardiologist:  Nelva Bush, MD Electrophysiologist:  None   Chief Complaint:  Follow-up heart failure  History of Present Illness:    Stacy Newman is a 73 y.o. female who presents via audio/video conferencing for a telehealth visit today.  She has a history of chronic systolic heart failure due to NICM, HTN, HLD, DM2, hypothyroidism, and remote breast cancer s/p lumpectomy and chemoradiation.  We are speaking today for follow-up of her heart failure.  She was last seen in our office in 05/2018 by Christell Faith, PA.  She was doing well at that time.  No medication changes were made at that time.  Today, Stacy Newman reports feeling well.  The shortness of breath that first brought her to our attentions last year has completely resolved.  She notes infrequent ankle swelling and needs to use prn furosemide only ~1/mo.  She denies chest pain, palpitations, and lightheadedness.  BP is well-controlled.  At times, she is concerned that her BP  may be a little low.  She has experienced a few episodes of generalized malaise in the morning and found her BP to be as low as 109/59.  The malaise usually resolves on its own over the course of 25-30 minutes.  Stacy Newman is currently not working on account of COVID-19.  She has also not been walking as much.  She is trying to minimize her sodium intake.  The patient does not have symptoms concerning for COVID-19 infection (fever, chills, cough, or new shortness of breath).    Past Medical History:  Diagnosis Date  . Cancer (HCC)    BREAST  . Diabetes mellitus without complication (HCC)    TYPE 11  . Hypercholesterolemia   . Hypertension   . Hypothyroidism    Past Surgical History:  Procedure Laterality Date  . BREAST SURGERY  2001   LUMPECTOMY LEFT SIDE  . CATARACT EXTRACTION W/PHACO Right 03/31/2015   Procedure: CATARACT EXTRACTION PHACO AND INTRAOCULAR LENS PLACEMENT (IOC);  Surgeon: Ronnell Freshwater, MD;  Location: [facility];  Service: Ophthalmology;  Laterality: Right;  DIABETIC-ORAL MEDS  . CATARACT EXTRACTION W/PHACO Left 12/29/2015   Procedure: CATARACT EXTRACTION PHACO AND INTRAOCULAR LENS PLACEMENT (IOC);  Surgeon: Ronnell Freshwater, MD;  Location: [facility];  Service: Ophthalmology;  Laterality: Left;  DIABETIC - oral meds LEAVE PT AT 830 ARRIVAL TIME  . RIGHT/LEFT HEART CATH AND CORONARY ANGIOGRAPHY Bilateral 02/28/2018   Procedure: RIGHT/LEFT HEART CATH AND CORONARY ANGIOGRAPHY;  Surgeon: Nelva Bush, MD;  Location: [facility];  Service: Cardiovascular;  Laterality: Bilateral;  Current Meds  Medication Sig  . aspirin 81 MG tablet Take 81 mg by mouth daily. LUNCH  . carvedilol (COREG) 6.25 MG tablet Take 1 tablet (6.25 mg total) by mouth 2 (two) times daily.  . cetirizine (ZYRTEC) 10 MG tablet Take by mouth.  . Cyanocobalamin (VITAMIN B12) 3000 MCG/ML LIQD Place 1 Dose under the tongue.  . furosemide (LASIX) 40 MG  tablet Take 1 tablet (40 mg total) by mouth daily as needed. For swelling or weight gain.  Marland Kitchen glipiZIDE (GLUCOTROL) 5 MG tablet Take by mouth daily. BEDTIME  . levothyroxine (SYNTHROID, LEVOTHROID) 50 MCG tablet Take 50 mcg by mouth daily before breakfast.  . lisinopril (PRINIVIL,ZESTRIL) 10 MG tablet Take 1 tablet (10 mg total) by mouth daily.  . metFORMIN (GLUCOPHAGE) 500 MG tablet Take 500 mg by mouth daily. LUNCH  . Multiple Vitamins-Minerals (MULTIVITAL PO) Take 1 tablet by mouth every other day.  . potassium chloride (K-DUR) 10 MEQ tablet Take 10 mEq by mouth daily. LUNCH  . simvastatin (ZOCOR) 40 MG tablet Take 40 mg by mouth daily. Reported on 12/29/2015  . Vitamin D, Ergocalciferol, (DRISDOL) 50000 UNITS CAPS capsule Take 50,000 Units by mouth every 7 (seven) days.     Allergies:   Food color red [red dye]   Social History   Tobacco Use  . Smoking status: Never Smoker  . Smokeless tobacco: Never Used  Substance Use Topics  . Alcohol use: No  . Drug use: Never     Family Hx: The patient's family history includes Alzheimer's disease in her mother; Lung cancer in her father.  ROS:   Please see the history of present illness.   All other systems reviewed and are negative.   Prior CV studies:   The following studies were reviewed today:  L/RHC (02/28/2018): 1. Mild to moderate, non-obstructive coronary artery disease with 20-30% mid LAD and proximal LCx stenoses, as well as 40% proximal D1 lesion.  Findings are consistent with non-ischemic cardiomyopathy. 2. Mildly to moderately elevated left heart filling pressures. 3. Mildly elevated right heart filling pressures. 4. Moderate pulmonary hypertension. 5. Reduced Fick cardiac output/index.  Echo (02/21/2018): Upper normal LV size with normal wall thickness.  LVEF 35-40% with severe anterior/anteroseptal hypokinesis.  Grade 1 diastolic dysfunction with elevated filling pressures.  Aortic sclerosis.  Mild MR.  Normal RV size and  function.  Labs/Other Tests and Data Reviewed:    EKG:  No ECG reviewed.  Recent Labs: 02/23/2018: ALT 15; Hemoglobin 10.9; Platelets 265 06/14/2018: BUN 32; Creatinine, Ser 0.94; Potassium 4.2; Sodium 137   Recent Lipid Panel No results found for: CHOL, TRIG, HDL, CHOLHDL, LDLCALC, LDLDIRECT  Wt Readings from Last 3 Encounters:  11/23/18 163 lb (73.9 kg)  06/06/18 168 lb 5 oz (76.3 kg)  03/15/18 161 lb 8 oz (73.3 kg)     Objective:    Vital Signs:  BP 112/70 (BP Location: Left Arm, Patient Position: Sitting, Cuff Size: Normal)   Pulse 84   Ht 5\' 4"  (1.626 m)   Wt 163 lb (73.9 kg)   BMI 27.98 kg/m    ASSESSMENT & PLAN:    Chronic systolic and diastolic heart failure due to non-ischemic cardiomyopathy: Stacy Newman continues to due well without significant fluid retention based on her report and NYHA class I-II symptoms.  Given soft BP at times, I am reluctant to escalate her evidence-based heart failure medications (carvedilol an lisinopril) today.  We will request labs drawn through her PCP's office in January to  f/u on renal function and potassium.  If we are unable to get the results (not available in Care Everywhere), we will check a BMP at the patient's convenience once COVID-19 precautions have been eased.  Non-obstructive coronary artery diease: No symptoms to suggest worsening coronary insufficiency.  Continue aspirin and simvastatin to prevent progression of disease.  Hypertension: Normal BP and borderline low at times per Stacy Newman's report.  We will continue current regimen and follow-up on labs, as outlined above.  Hyperlipidemia: Managed by Stacy Newman.  Continue current dose of statin for now with target LDL < 70.  COVID-19 Education: The signs and symptoms of COVID-19 were discussed with the patient and how to seek care for testing (follow up with PCP or arrange E-visit).  The importance of social distancing was discussed today.  Time:   Today, I have  spent 11 minutes with the patient with telehealth technology discussing the above problems.     Medication Adjustments/Labs and Tests Ordered: Current medicines are reviewed at length with the patient today.  Concerns regarding medicines are outlined above.   Tests Ordered: None.  Medication Changes: None.  Disposition:  Follow up in 6 month(s)  Signed, Nelva Bush, MD  11/23/2018 8:28 AM    [facility]

## 2018-11-23 NOTE — Patient Instructions (Addendum)
Medication Instructions:  Your physician recommends that you continue on your current medications as directed. Please refer to the Current Medication list given to you today.  If you need a refill on your cardiac medications before your next appointment, please call your pharmacy.   Lab work: Labwork will be requested from your primary care physician.  Your physician recommends that you return for lab work in: a few months once the [place] has eased. (BMP)  You may do so by calling our office sometime in June to schedule  an appointment.     If you have labs (blood work) drawn today and your tests are completely normal, you will receive your results only by: Marland Kitchen MyChart Message (if you have MyChart) OR . A paper copy in the mail If you have any lab test that is abnormal or we need to change your treatment, we will call you to review the results.  Testing/Procedures: none  Follow-Up: At [facility], you and your health needs are our priority.  As part of our continuing mission to provide you with exceptional heart care, we have created designated Provider Care Teams.  These Care Teams include your primary Cardiologist (physician) and Advanced Practice Providers (APPs -  Physician Assistants and Nurse Practitioners) who all work together to provide you with the care you need, when you need it. You will need a follow up appointment in 6 months.  Please call our office 2 months in advance to schedule this appointment.  You may see DR Harrell Gave END or one of the following Advanced Practice Providers on your designated Care Team:   Murray Hodgkins, NP Christell Faith, PA-C . Marrianne Mood, PA-C

## 2019-07-20 ENCOUNTER — Telehealth (INDEPENDENT_AMBULATORY_CARE_PROVIDER_SITE_OTHER): Payer: BC Managed Care – PPO | Admitting: Internal Medicine

## 2019-07-20 ENCOUNTER — Other Ambulatory Visit: Payer: Self-pay

## 2019-07-20 ENCOUNTER — Telehealth: Payer: Self-pay | Admitting: *Deleted

## 2019-07-20 ENCOUNTER — Encounter: Payer: Self-pay | Admitting: Internal Medicine

## 2019-07-20 VITALS — BP 120/75 | Ht 64.0 in | Wt 166.0 lb

## 2019-07-20 DIAGNOSIS — I428 Other cardiomyopathies: Secondary | ICD-10-CM

## 2019-07-20 DIAGNOSIS — I5022 Chronic systolic (congestive) heart failure: Secondary | ICD-10-CM

## 2019-07-20 DIAGNOSIS — Z79899 Other long term (current) drug therapy: Secondary | ICD-10-CM

## 2019-07-20 DIAGNOSIS — I2510 Atherosclerotic heart disease of native coronary artery without angina pectoris: Secondary | ICD-10-CM

## 2019-07-20 DIAGNOSIS — I10 Essential (primary) hypertension: Secondary | ICD-10-CM

## 2019-07-20 NOTE — Progress Notes (Signed)
Virtual Visit via Telephone Note   This visit type was conducted due to national recommendations for restrictions regarding the COVID-19 Pandemic (e.g. social distancing) in an effort to limit this patient's exposure and mitigate transmission in our community.  Due to her co-morbid illnesses, this patient is at least at moderate risk for complications without adequate follow up.  This format is felt to be most appropriate for this patient at this time.  The patient did not have access to video technology/had technical difficulties with video requiring transitioning to audio format only (telephone).  All issues noted in this document were discussed and addressed.  No physical exam could be performed with this format.  Please refer to the patient's chart for her  consent to telehealth for [facility].   Date:  07/20/2019   ID:  Stacy Newman, DOB [birth year], MRN [id number]  Patient Location: Home Provider Location: Home  PCP:  Lonn Georgia, FNP  Cardiologist:  Nelva Bush, MD Electrophysiologist:  None   Evaluation Performed:  Follow-Up Visit  Chief Complaint:  Follow-up heart failure  History of Present Illness:    Stacy Newman is a 73 y.o. female with history of chronic systolic heart failure due to nonischemic cardiomyopathy, hypertension, hyperlipidemia, type 2 diabetes mellitus, hypothyroidism, and remote breast cancer status post lumpectomy and chemoradiation.  We are speaking today for follow-up of heart failure.  We last spoke in April, at which time she was doing well without any shortness of breath.  Lower extremity edema was well controlled with as needed furosemide.  She was concerned that her blood pressure was a little low at times with resultant generalized malaise.  No medication changes were made at that time.  Today, Stacy Newman reports feeling well.  Her edema is well-controlled with daily furosemide 40 mg (she only rarely needs to take an extra dose).   She denies chest pain, shortness of breath, palpitations, and lightheadedness.  She is trying walk several days a week.  She is tolerating her medications well, though she is now taking an OTC potassium supplement, as she ran out of potassium chloride.  The patient does not have symptoms concerning for COVID-19 infection (fever, chills, cough, or new shortness of breath).    Past Medical History:  Diagnosis Date   Cancer ([place])    BREAST   Diabetes mellitus without complication ([place])    TYPE 11   Hypercholesterolemia    Hypertension    Hypothyroidism    Past Surgical History:  Procedure Laterality Date   BREAST SURGERY  2001   LUMPECTOMY LEFT SIDE   CATARACT EXTRACTION W/PHACO Right 03/31/2015   Procedure: CATARACT EXTRACTION PHACO AND INTRAOCULAR LENS PLACEMENT ([place]);  Surgeon: Ronnell Freshwater, MD;  Location: [facility];  Service: Ophthalmology;  Laterality: Right;  DIABETIC-ORAL MEDS   CATARACT EXTRACTION W/PHACO Left 12/29/2015   Procedure: CATARACT EXTRACTION PHACO AND INTRAOCULAR LENS PLACEMENT (IOC);  Surgeon: Ronnell Freshwater, MD;  Location: [facility];  Service: Ophthalmology;  Laterality: Left;  DIABETIC - oral meds LEAVE PT AT 830 ARRIVAL TIME   RIGHT/LEFT HEART CATH AND CORONARY ANGIOGRAPHY Bilateral 02/28/2018   Procedure: RIGHT/LEFT HEART CATH AND CORONARY ANGIOGRAPHY;  Surgeon: Nelva Bush, MD;  Location: [facility];  Service: Cardiovascular;  Laterality: Bilateral;     Current Meds  Medication Sig   aspirin 81 MG tablet Take 81 mg by mouth daily. LUNCH   carvedilol (COREG) 6.25 MG tablet Take 1 tablet (6.25 mg total) by mouth 2 (  two) times daily.   cetirizine (ZYRTEC) 10 MG tablet Take by mouth.   Cyanocobalamin (VITAMIN B12) 3000 MCG/ML LIQD Place 1 Dose under the tongue.   furosemide (LASIX) 40 MG tablet Take 1 tablet (40 mg total) by mouth daily as needed. For swelling or weight gain.   glipiZIDE  (GLUCOTROL) 5 MG tablet Take by mouth daily. BEDTIME   levothyroxine (SYNTHROID, LEVOTHROID) 50 MCG tablet Take 50 mcg by mouth daily before breakfast.   lisinopril (PRINIVIL,ZESTRIL) 10 MG tablet Take 1 tablet (10 mg total) by mouth daily.   metFORMIN (GLUCOPHAGE) 500 MG tablet Take 500 mg by mouth daily. LUNCH   Multiple Vitamins-Minerals (MULTIVITAL PO) Take 1 tablet by mouth every other day.   simvastatin (ZOCOR) 40 MG tablet Take 40 mg by mouth daily. Reported on 12/29/2015     Allergies:   Food color red [red dye]   Social History   Tobacco Use   Smoking status: Never Smoker   Smokeless tobacco: Never Used  Substance Use Topics   Alcohol use: No   Drug use: Never     Family Hx: The patient's family history includes Alzheimer's disease in her mother; Lung cancer in her father.  ROS:   Please see the history of present illness.   All other systems reviewed and are negative.   Prior CV studies:   The following studies were reviewed today:  L/RHC (02/28/2018): 1. Mild to moderate, non-obstructive coronary artery disease with 20-30% mid LAD and proximal LCx stenoses, as well as 40% proximal D1 lesion. Findings are consistent with non-ischemic cardiomyopathy. 2. Mildly to moderately elevated left heart filling pressures. 3. Mildly elevated right heart filling pressures. 4. Moderate pulmonary hypertension. 5. Reduced Fick cardiac output/index.  Echo (02/21/2018): Upper normal LV size with normal wall thickness.  LVEF 35-40% with severe anterior/anteroseptal hypokinesis.  Grade 1 diastolic dysfunction with elevated filling pressures.  Aortic sclerosis.  Mild MR.  Normal RV size and function.  Labs/Other Tests and Data Reviewed:    EKG:  No ECG reviewed.  Recent Labs: No results found for requested labs within last 8760 hours.   Recent Lipid Panel No results found for: CHOL, TRIG, HDL, CHOLHDL, LDLCALC, LDLDIRECT  Wt Readings from Last 3 Encounters:  07/20/19 166  lb (75.3 kg)  11/23/18 163 lb (73.9 kg)  06/06/18 168 lb 5 oz (76.3 kg)     Objective:    Vital Signs:  BP 120/75 (BP Location: Left Arm, Patient Position: Sitting, Cuff Size: Normal)    Ht 5\' 4"  (1.626 m)    Wt 166 lb (75.3 kg)    BMI 28.49 kg/m    VITAL SIGNS:  reviewed  ASSESSMENT & PLAN:    Chronic systolic heart failure due to nonischemic cardiomyopathy: Stacy Newman continues to do well with NYHA class I HF.  We will not make any medication changes today.  I will have her return at her convenience for a BMP to reassess renal function and electrolytes.  We will repeat an echo shortly before our next visit to reassess her LV function.  CAD: Non-obstructive wsa noted on cath in 02/2018.  Continue medical therapy to prevent progression of disease.  Lipids being followed by her PCP with goal LDL < 70.  Hypertension: BP well-controlled today.  No medicaiton changes planned.  Follow-up: Return to clinic in 6 months.  COVID-19 Education: The signs and symptoms of COVID-19 were discussed with the patient and how to seek care for testing (follow up with PCP or  arrange E-visit).  The importance of social distancing was discussed today.  Time:   Today, I have spent 17 minutes with the patient with telehealth technology discussing the above problems.     Medication Adjustments/Labs and Tests Ordered: Current medicines are reviewed at length with the patient today.  Concerns regarding medicines are outlined above.   Tests Ordered: Orders Placed This Encounter  Procedures   Basic Metabolic Panel (BMET)   ECHOCARDIOGRAM COMPLETE    Medication Changes: None.  Follow Up:  In Person in 6 month(s)  Signed, Nelva Bush, MD  07/20/2019 9:13 PM    [place]

## 2019-07-20 NOTE — Telephone Encounter (Signed)
Called patient and went over AVS from today's visit. She verbalized understanding of the instructions and will be able to go get lab work next Thursday at [facility].  6 month follow up scheduled.  She is aware that she will need echo in about 6 months as well prior to appointment.  AVS mailed to patient.

## 2019-07-20 NOTE — Patient Instructions (Signed)
Medication Instructions:  Your physician recommends that you continue on your current medications as directed. Please refer to the Current Medication list given to you today.  *If you need a refill on your cardiac medications before your next appointment, please call your pharmacy*  Lab Work: Your physician recommends that you return for lab work in: at your earliest convenience. If you can go next week.  - BMET. - Please go to the [facility]. You will check in at the front desk to the right as you walk into the atrium. Valet Parking is offered if needed. - No appointment needed. You may go any day between 7 am and 6 pm.  If you have labs (blood work) drawn today and your tests are completely normal, you will receive your results only by: Marland Kitchen MyChart Message (if you have MyChart) OR . A paper copy in the mail If you have any lab test that is abnormal or we need to change your treatment, we will call you to review the results.  Testing/Procedures: Your physician has requested that you have an echocardiogram in 6 months prior to follow up appointment. Echocardiography is a painless test that uses sound waves to create images of your heart. It provides your doctor with information about the size and shape of your heart and how well your heart's chambers and valves are working. This procedure takes approximately one hour. There are no restrictions for this procedure. You may get an IV, if needed, to receive an ultrasound enhancing agent through to better visualize your heart.   Follow-Up: At [facility], you and your health needs are our priority.  As part of our continuing mission to provide you with exceptional heart care, we have created designated Provider Care Teams.  These Care Teams include your primary Cardiologist (physician) and Advanced Practice Providers (APPs -  Physician Assistants and Nurse Practitioners) who all work together to provide you with the care you need, when you  need it.  Your next appointment:   6 month(s) after you've had your echo.  The format for your next appointment:   In Person  Provider:    You may see DR Harrell Gave END or one of the following Advanced Practice Providers on your designated Care Team:    Murray Hodgkins, NP  Christell Faith, PA-C  Marrianne Mood, PA-C

## 2019-07-26 ENCOUNTER — Other Ambulatory Visit
Admission: RE | Admit: 2019-07-26 | Discharge: 2019-07-26 | Disposition: A | Discharge status: Home / Self Care | Payer: BC Managed Care – PPO | Source: Ambulatory Visit | Attending: Internal Medicine | Admitting: Internal Medicine

## 2019-07-26 ENCOUNTER — Telehealth: Payer: Self-pay | Admitting: *Deleted

## 2019-07-26 DIAGNOSIS — I5022 Chronic systolic (congestive) heart failure: Secondary | ICD-10-CM | POA: Insufficient documentation

## 2019-07-26 DIAGNOSIS — Z79899 Other long term (current) drug therapy: Secondary | ICD-10-CM | POA: Insufficient documentation

## 2019-07-26 LAB — BASIC METABOLIC PANEL
Anion gap: 10 (ref 5–15)
BUN: 29 mg/dL — ABNORMAL HIGH (ref 8–23)
CO2: 23 mmol/L (ref 22–32)
Calcium: 9 mg/dL (ref 8.9–10.3)
Chloride: 105 mmol/L (ref 98–111)
Creatinine, Ser: 0.89 mg/dL (ref 0.44–1.00)
GFR calc Af Amer: >60 mL/min (ref >60)
GFR calc non Af Amer: >60 mL/min (ref >60)
Glucose, Bld: 83 mg/dL (ref 70–99)
Potassium: 4.3 mmol/L (ref 3.5–5.1)
Sodium: 138 mmol/L (ref 135–145)

## 2019-07-26 NOTE — Telephone Encounter (Signed)
Patient returning call.

## 2019-07-26 NOTE — Telephone Encounter (Signed)
No answer. Left message to call back.   

## 2019-07-26 NOTE — Telephone Encounter (Signed)
Results called to pt. Pt verbalized understanding.  

## 2019-07-26 NOTE — Telephone Encounter (Signed)
-----   Message from Nelva Bush, MD sent at 07/26/2019  2:02 PM EST ----- Please let Ms. Obryant know that her kidney function and electrolytes are normal.  She should continue her current medications and follow-up as discussed at our virtual visit last week.

## 2019-08-26 ENCOUNTER — Ambulatory Visit: Payer: Self-pay | Attending: Internal Medicine

## 2019-08-26 DIAGNOSIS — Z23 Encounter for immunization: Secondary | ICD-10-CM | POA: Insufficient documentation

## 2019-08-26 NOTE — Progress Notes (Signed)
   Covid-19 Vaccination Clinic  Name:  Ralene Cork    MRN: [id number] DOB: 25-Jul-1946  08/26/2019  Ms. Raylene Miyamoto was observed post Covid-19 immunization for 15 minutes without incidence. She was provided with Vaccine Information Sheet and instruction to access the V-Safe system.   Ms. Raylene Miyamoto was instructed to call 911 with any severe reactions post vaccine: Marland Kitchen Difficulty breathing  . Swelling of your face and throat  . A fast heartbeat  . A bad rash all over your body  . Dizziness and weakness    Immunizations Administered    Name Date Dose VIS Date Route   Pfizer COVID-19 Vaccine 08/26/2019  2:44 PM 0.3 mL 07/27/2019 Intramuscular   Manufacturer: [place]   Lot: [id number]   [place]: [id number]

## 2019-09-16 ENCOUNTER — Ambulatory Visit: Payer: Self-pay | Attending: Internal Medicine

## 2019-10-01 ENCOUNTER — Telehealth: Payer: Self-pay

## 2019-10-01 NOTE — Telephone Encounter (Signed)
Called pt. to inquire about need to reschedule her 2nd COVID vaccine.  Pt. Stated she was sent from [facility] to the "[facility]" on 09/14/19, and rec'd her 2nd vaccine.  Denied any other needs at this time.

## 2019-12-25 ENCOUNTER — Other Ambulatory Visit: Payer: Self-pay

## 2019-12-25 ENCOUNTER — Ambulatory Visit (INDEPENDENT_AMBULATORY_CARE_PROVIDER_SITE_OTHER): Payer: BC Managed Care – PPO

## 2019-12-25 DIAGNOSIS — I5022 Chronic systolic (congestive) heart failure: Secondary | ICD-10-CM

## 2020-01-28 NOTE — Progress Notes (Signed)
Follow-up Outpatient Visit Date: 01/30/2020  Primary Care Provider: Lonn Georgia, [place] [address]  Chief Complaint: Follow-up cardiomyopathy  HPI:  Stacy Newman is a 74 y.o. female with history of chronic systolic heart failure due to nonischemic cardiomyopathy, hypertension, hyperlipidemia, type 2 diabetes mellitus, hypothyroidism, and remote breast cancer status post lumpectomy and chemoradiation, who presents for follow-up of heart failure.  We last spoke via virtual visit in 07/2019, at which time Stacy Newman was feeling well.  Follow-up echo last month showed modest improvement in LVEF to 40-45% (previously 35-40% in 02/2018).  Today, Stacy Newman reports that she has been feeling well.  She noted some dyspnea earlier during the COVID-19 pandemic when walking while wearing a mask.  This has resolved.  She denies chest pain, palpitations, lightheadedness, and edema.  She is trying to do some exercise at home.  She notes her systolic blood pressures are usually in the 120 to 130 mmHg range.  She is using furosemide once or twice a week on an as-needed basis for leg swelling.  --------------------------------------------------------------------------------------------------  Past Medical History:  Diagnosis Date  . Cancer (HCC)    BREAST  . Diabetes mellitus without complication (HCC)    TYPE 11  . Hypercholesterolemia   . Hypertension   . Hypothyroidism    Past Surgical History:  Procedure Laterality Date  . BREAST SURGERY  2001   LUMPECTOMY LEFT SIDE  . CARDIAC CATHETERIZATION    . CATARACT EXTRACTION W/PHACO Right 03/31/2015   Procedure: CATARACT EXTRACTION PHACO AND INTRAOCULAR LENS PLACEMENT (IOC);  Surgeon: Ronnell Freshwater, MD;  Location: [place];  Service: Ophthalmology;  Laterality: Right;  DIABETIC-ORAL MEDS  . CATARACT EXTRACTION W/PHACO Left 12/29/2015   Procedure: CATARACT EXTRACTION PHACO AND INTRAOCULAR LENS  PLACEMENT (IOC);  Surgeon: Ronnell Freshwater, MD;  Location: [facility];  Service: Ophthalmology;  Laterality: Left;  DIABETIC - oral meds LEAVE PT AT 830 ARRIVAL TIME  . RIGHT/LEFT HEART CATH AND CORONARY ANGIOGRAPHY Bilateral 02/28/2018   Procedure: RIGHT/LEFT HEART CATH AND CORONARY ANGIOGRAPHY;  Surgeon: Nelva Bush, MD;  Location: [place] [facility];  Service: Cardiovascular;  Laterality: Bilateral;    Current Meds  Medication Sig  . aspirin 81 MG tablet Take 81 mg by mouth daily. LUNCH  . carvedilol (COREG) 6.25 MG tablet Take 1 tablet (6.25 mg total) by mouth 2 (two) times daily.  . cetirizine (ZYRTEC) 10 MG tablet Take 10 mg by mouth daily as needed.   . Cyanocobalamin (VITAMIN B12) 3000 MCG/ML LIQD Place 1 Dose under the tongue daily.   . furosemide (LASIX) 40 MG tablet Take 1 tablet (40 mg total) by mouth daily as needed. For swelling or weight gain.  Marland Kitchen glipiZIDE (GLUCOTROL) 5 MG tablet Take by mouth daily. BEDTIME  . levothyroxine (SYNTHROID, LEVOTHROID) 50 MCG tablet Take 50 mcg by mouth daily before breakfast.  . lisinopril (PRINIVIL,ZESTRIL) 10 MG tablet Take 1 tablet (10 mg total) by mouth daily.  . metFORMIN (GLUCOPHAGE) 500 MG tablet Take 500 mg by mouth daily. LUNCH  . Multiple Vitamins-Minerals (MULTIVITAL PO) Take 1 tablet by mouth every other day.  . potassium chloride (K-DUR) 10 MEQ tablet Take 10 mEq by mouth daily. LUNCH  . simvastatin (ZOCOR) 40 MG tablet Take 40 mg by mouth daily. Reported on 12/29/2015  . Vitamin D, Ergocalciferol, (DRISDOL) 50000 UNITS CAPS capsule Take 50,000 Units by mouth every 7 (seven) days.    Allergies: Food color red [red dye]  Social History   Tobacco Use  . Smoking status: Never Smoker  . Smokeless tobacco: Never Used  Vaping Use  . Vaping Use: Never used  Substance Use Topics  . Alcohol use: No  . Drug use: Never    Family History  Problem Relation Age of Onset  . Alzheimer's disease Mother   . Lung  cancer Father     Review of Systems: A 12-system review of systems was performed and was negative except as noted in the HPI.  --------------------------------------------------------------------------------------------------  Physical Exam: BP (!) 150/90 (BP Location: Right Arm, Patient Position: Sitting, Cuff Size: Normal)   Pulse 77   Ht 5\' 4"  (1.626 m)   Wt 172 lb (78 kg)   SpO2 99%   BMI 29.52 kg/m   General: NAD. Neck: No JVD or HJR. Lungs: Clear to auscultation without wheezes or crackles. Heart: Regular rate and rhythm without murmurs, rubs, or gallops. Abdomen: Soft, nontender, nondistended. Extremities: No lower extremity edema.  EKG: Normal sinus rhythm without abnormality.  Lab Results  Component Value Date   WBC 3.7 02/23/2018   HGB 10.9 (L) 02/23/2018   HCT 33.1 (L) 02/23/2018   MCV 83.2 02/23/2018   PLT 265 02/23/2018    Lab Results  Component Value Date   NA 138 07/26/2019   K 4.3 07/26/2019   CL 105 07/26/2019   CO2 23 07/26/2019   BUN 29 (H) 07/26/2019   CREATININE 0.89 07/26/2019   GLUCOSE 83 07/26/2019   ALT 15 02/23/2018    --------------------------------------------------------------------------------------------------  ASSESSMENT AND PLAN: Chronic HFrEF due to nonischemic cardiomyopathy: Stacy Newman is doing well with NYHA class I-II symptoms.  She appears euvolemic on exam today.  Given mildly elevated blood pressure today and normal readings at home, we will take this opportunity to increase lisinopril to 20 mg daily.  I will check a basic metabolic panel today and again in about 2 weeks.  We will continue carvedilol 6.25 mg twice daily for the time being.  Nonobstructive coronary artery disease: No symptoms reported to suggest worsening coronary insufficiency.  We will continue aspirin and statin therapy with target LDL less than 70 (lipids followed by Ms. Karman's PCP).  Hypertension: Blood pressure mildly elevated today.  As  above, we will increase lisinopril to 20 mg daily and continue current dose of carvedilol.  Follow-up: Return to clinic in 6 months.  Nelva Bush, MD 01/30/2020 9:33 AM

## 2020-01-30 ENCOUNTER — Encounter: Payer: Self-pay | Admitting: Internal Medicine

## 2020-01-30 ENCOUNTER — Ambulatory Visit: Payer: BC Managed Care – PPO | Admitting: Internal Medicine

## 2020-01-30 ENCOUNTER — Other Ambulatory Visit: Payer: Self-pay

## 2020-01-30 VITALS — BP 150/90 | HR 77 | Ht 64.0 in | Wt 172.0 lb

## 2020-01-30 DIAGNOSIS — I428 Other cardiomyopathies: Secondary | ICD-10-CM | POA: Diagnosis not present

## 2020-01-30 DIAGNOSIS — I10 Essential (primary) hypertension: Secondary | ICD-10-CM | POA: Diagnosis not present

## 2020-01-30 DIAGNOSIS — I5022 Chronic systolic (congestive) heart failure: Secondary | ICD-10-CM

## 2020-01-30 DIAGNOSIS — I2510 Atherosclerotic heart disease of native coronary artery without angina pectoris: Secondary | ICD-10-CM

## 2020-01-30 DIAGNOSIS — Z79899 Other long term (current) drug therapy: Secondary | ICD-10-CM

## 2020-01-30 MED ORDER — LISINOPRIL 20 MG PO TABS: 20.0000 mg | ORAL_TABLET | Freq: Every day | ORAL | 2 refills | Status: DC

## 2020-01-30 NOTE — Patient Instructions (Signed)
Medication Instructions:  Your physician has recommended you make the following change in your medication:  1- INCREASE Lisinopril to 20 mg by mouth once a day.  *If you need a refill on your cardiac medications before your next appointment, please call your pharmacy*   Lab Work: Your physician recommends that you return for lab work in: [place].   2- Your physician recommends that you return for lab work in: 2 weeks around February 13, 2020. - BMET. - Please go to the [facility]. You will check in at the front desk to the right as you walk into the atrium. Valet Parking is offered if needed. - No appointment needed. You may go any day between 7 am and 6 pm.  If you have labs (blood work) drawn today and your tests are completely normal, you will receive your results only by: Marland Kitchen MyChart Message (if you have MyChart) OR . A paper copy in the mail If you have any lab test that is abnormal or we need to change your treatment, we will call you to review the results.   Testing/Procedures: none   Follow-Up: At [facility], you and your health needs are our priority.  As part of our continuing mission to provide you with exceptional heart care, we have created designated Provider Care Teams.  These Care Teams include your primary Cardiologist (physician) and Advanced Practice Providers (APPs -  Physician Assistants and Nurse Practitioners) who all work together to provide you with the care you need, when you need it.  We recommend signing up for the patient portal called "MyChart".  Sign up information is provided on this After Visit Summary.  MyChart is used to connect with patients for Virtual Visits (Telemedicine).  Patients are able to view lab/test results, encounter notes, upcoming appointments, etc.  Non-urgent messages can be sent to your provider as well.   To learn more about what you can do with MyChart, go to [email or web address].    Your next appointment:   6  month(s)  The format for your next appointment:   In Person  Provider:    You may see DR Harrell Gave END or one of the following Advanced Practice Providers on your designated Care Team:    Murray Hodgkins, NP  Christell Faith, PA-C  Marrianne Mood, PA-C

## 2020-01-31 LAB — BASIC METABOLIC PANEL
BUN/Creatinine Ratio: 27 (ref 12–28)
BUN: 23 mg/dL (ref 8–27)
CO2: 20 mmol/L (ref 20–29)
Calcium: 9.3 mg/dL (ref 8.7–10.3)
Chloride: 103 mmol/L (ref 96–106)
Creatinine, Ser: 0.86 mg/dL (ref 0.57–1.00)
GFR calc Af Amer: 78 mL/min/{1.73_m2} (ref >59)
GFR calc non Af Amer: 68 mL/min/{1.73_m2} (ref >59)
Glucose: 147 mg/dL — ABNORMAL HIGH (ref 65–99)
Potassium: 4.2 mmol/L (ref 3.5–5.2)
Sodium: 140 mmol/L (ref 134–144)

## 2020-06-27 ENCOUNTER — Telehealth: Payer: Self-pay | Admitting: Internal Medicine

## 2020-06-27 ENCOUNTER — Emergency Department: Payer: BC Managed Care – PPO

## 2020-06-27 ENCOUNTER — Encounter: Payer: Self-pay | Admitting: Emergency Medicine

## 2020-06-27 ENCOUNTER — Other Ambulatory Visit: Payer: Self-pay

## 2020-06-27 ENCOUNTER — Inpatient Hospital Stay
Admission: EM | Admit: 2020-06-27 | Discharge: 2020-07-01 | DRG: 246 | Disposition: A | Discharge status: Home / Self Care | Payer: BC Managed Care – PPO | Attending: Internal Medicine | Admitting: Internal Medicine

## 2020-06-27 DIAGNOSIS — D649 Anemia, unspecified: Secondary | ICD-10-CM

## 2020-06-27 DIAGNOSIS — Z20822 Contact with and (suspected) exposure to covid-19: Secondary | ICD-10-CM | POA: Diagnosis present

## 2020-06-27 DIAGNOSIS — Z820 Family history of epilepsy and other diseases of the nervous system: Secondary | ICD-10-CM | POA: Diagnosis not present

## 2020-06-27 DIAGNOSIS — I472 Ventricular tachycardia: Secondary | ICD-10-CM | POA: Diagnosis present

## 2020-06-27 DIAGNOSIS — I5022 Chronic systolic (congestive) heart failure: Secondary | ICD-10-CM | POA: Diagnosis present

## 2020-06-27 DIAGNOSIS — Z853 Personal history of malignant neoplasm of breast: Secondary | ICD-10-CM

## 2020-06-27 DIAGNOSIS — I10 Essential (primary) hypertension: Secondary | ICD-10-CM | POA: Diagnosis not present

## 2020-06-27 DIAGNOSIS — Z566 Other physical and mental strain related to work: Secondary | ICD-10-CM | POA: Diagnosis not present

## 2020-06-27 DIAGNOSIS — E785 Hyperlipidemia, unspecified: Secondary | ICD-10-CM | POA: Diagnosis present

## 2020-06-27 DIAGNOSIS — Z7984 Long term (current) use of oral hypoglycemic drugs: Secondary | ICD-10-CM

## 2020-06-27 DIAGNOSIS — I471 Supraventricular tachycardia: Secondary | ICD-10-CM | POA: Diagnosis present

## 2020-06-27 DIAGNOSIS — E119 Type 2 diabetes mellitus without complications: Secondary | ICD-10-CM

## 2020-06-27 DIAGNOSIS — Z801 Family history of malignant neoplasm of trachea, bronchus and lung: Secondary | ICD-10-CM

## 2020-06-27 DIAGNOSIS — I2510 Atherosclerotic heart disease of native coronary artery without angina pectoris: Secondary | ICD-10-CM | POA: Diagnosis not present

## 2020-06-27 DIAGNOSIS — Z888 Allergy status to other drugs, medicaments and biological substances status: Secondary | ICD-10-CM

## 2020-06-27 DIAGNOSIS — E876 Hypokalemia: Secondary | ICD-10-CM | POA: Diagnosis present

## 2020-06-27 DIAGNOSIS — I110 Hypertensive heart disease with heart failure: Secondary | ICD-10-CM | POA: Diagnosis present

## 2020-06-27 DIAGNOSIS — I25110 Atherosclerotic heart disease of native coronary artery with unstable angina pectoris: Secondary | ICD-10-CM | POA: Diagnosis present

## 2020-06-27 DIAGNOSIS — I160 Hypertensive urgency: Secondary | ICD-10-CM | POA: Diagnosis present

## 2020-06-27 DIAGNOSIS — E039 Hypothyroidism, unspecified: Secondary | ICD-10-CM

## 2020-06-27 DIAGNOSIS — E1169 Type 2 diabetes mellitus with other specified complication: Secondary | ICD-10-CM | POA: Diagnosis present

## 2020-06-27 DIAGNOSIS — I200 Unstable angina: Secondary | ICD-10-CM | POA: Diagnosis not present

## 2020-06-27 DIAGNOSIS — Z79890 Hormone replacement therapy (postmenopausal): Secondary | ICD-10-CM | POA: Diagnosis not present

## 2020-06-27 DIAGNOSIS — I272 Pulmonary hypertension, unspecified: Secondary | ICD-10-CM | POA: Diagnosis present

## 2020-06-27 DIAGNOSIS — I5043 Acute on chronic combined systolic (congestive) and diastolic (congestive) heart failure: Secondary | ICD-10-CM | POA: Diagnosis present

## 2020-06-27 DIAGNOSIS — I5042 Chronic combined systolic (congestive) and diastolic (congestive) heart failure: Secondary | ICD-10-CM | POA: Diagnosis not present

## 2020-06-27 DIAGNOSIS — D509 Iron deficiency anemia, unspecified: Secondary | ICD-10-CM | POA: Diagnosis present

## 2020-06-27 DIAGNOSIS — E118 Type 2 diabetes mellitus with unspecified complications: Secondary | ICD-10-CM

## 2020-06-27 DIAGNOSIS — I5023 Acute on chronic systolic (congestive) heart failure: Secondary | ICD-10-CM | POA: Diagnosis not present

## 2020-06-27 DIAGNOSIS — I214 Non-ST elevation (NSTEMI) myocardial infarction: Principal | ICD-10-CM | POA: Diagnosis present

## 2020-06-27 DIAGNOSIS — I428 Other cardiomyopathies: Secondary | ICD-10-CM | POA: Diagnosis present

## 2020-06-27 DIAGNOSIS — E7800 Pure hypercholesterolemia, unspecified: Secondary | ICD-10-CM | POA: Diagnosis present

## 2020-06-27 DIAGNOSIS — E8809 Other disorders of plasma-protein metabolism, not elsewhere classified: Secondary | ICD-10-CM | POA: Diagnosis present

## 2020-06-27 DIAGNOSIS — I420 Dilated cardiomyopathy: Secondary | ICD-10-CM | POA: Diagnosis not present

## 2020-06-27 DIAGNOSIS — Z79899 Other long term (current) drug therapy: Secondary | ICD-10-CM | POA: Diagnosis not present

## 2020-06-27 DIAGNOSIS — R7401 Elevation of levels of liver transaminase levels: Secondary | ICD-10-CM | POA: Diagnosis present

## 2020-06-27 DIAGNOSIS — R0789 Other chest pain: Secondary | ICD-10-CM | POA: Diagnosis present

## 2020-06-27 LAB — CBC
HCT: 27.2 % — ABNORMAL LOW (ref 36.0–46.0)
Hemoglobin: 9.3 g/dL — ABNORMAL LOW (ref 12.0–15.0)
MCH: 28.5 pg (ref 26.0–34.0)
MCHC: 34.2 g/dL (ref 30.0–36.0)
MCV: 83.4 fL (ref 80.0–100.0)
Platelets: 278 10*3/uL (ref 150–400)
RBC: 3.26 MIL/uL — ABNORMAL LOW (ref 3.87–5.11)
RDW: 14.6 % (ref 11.5–15.5)
WBC: 3.6 10*3/uL — ABNORMAL LOW (ref 4.0–10.5)
nRBC: 0 % (ref 0.0–0.2)

## 2020-06-27 LAB — BASIC METABOLIC PANEL
Anion gap: 13 (ref 5–15)
BUN: 21 mg/dL (ref 8–23)
CO2: 23 mmol/L (ref 22–32)
Calcium: 8.4 mg/dL — ABNORMAL LOW (ref 8.9–10.3)
Chloride: 98 mmol/L (ref 98–111)
Creatinine, Ser: 0.87 mg/dL (ref 0.44–1.00)
GFR, Estimated: >60 mL/min (ref >60)
Glucose, Bld: 157 mg/dL — ABNORMAL HIGH (ref 70–99)
Potassium: 3.4 mmol/L — ABNORMAL LOW (ref 3.5–5.1)
Sodium: 134 mmol/L — ABNORMAL LOW (ref 135–145)

## 2020-06-27 LAB — TROPONIN I (HIGH SENSITIVITY)
Troponin I (High Sensitivity): 2529 ng/L (ref <18)
Troponin I (High Sensitivity): 2858 ng/L (ref <18)

## 2020-06-27 LAB — GLUCOSE, CAPILLARY: Glucose-Capillary: 136 mg/dL — ABNORMAL HIGH (ref 70–99)

## 2020-06-27 LAB — APTT: aPTT: 31 seconds (ref 24–36)

## 2020-06-27 LAB — RESPIRATORY PANEL BY RT PCR (FLU A&B, COVID)
Influenza A by PCR: NEGATIVE
Influenza B by PCR: NEGATIVE
SARS Coronavirus 2 by RT PCR: NEGATIVE

## 2020-06-27 LAB — PROTIME-INR
INR: 1.2 (ref 0.8–1.2)
Prothrombin Time: 15 seconds (ref 11.4–15.2)

## 2020-06-27 MED ORDER — SIMVASTATIN 20 MG PO TABS
40.0000 mg | ORAL_TABLET | Freq: Every day | ORAL | Status: DC
  Administered 2020-06-28 – 2020-06-29 (×2): 40 mg via ORAL
  Filled 2020-06-27 (×2): qty 2

## 2020-06-27 MED ORDER — ACETAMINOPHEN 325 MG PO TABS
650.0000 mg | ORAL_TABLET | ORAL | Status: DC | PRN
  Administered 2020-06-28: 650 mg via ORAL
  Filled 2020-06-27: qty 2

## 2020-06-27 MED ORDER — SODIUM CHLORIDE 0.9% FLUSH: 3.0000 mL | INTRAVENOUS | Status: DC | PRN

## 2020-06-27 MED ORDER — POTASSIUM CHLORIDE CRYS ER 20 MEQ PO TBCR
40.0000 meq | EXTENDED_RELEASE_TABLET | Freq: Once | ORAL | Status: AC
  Administered 2020-06-27: 40 meq via ORAL
  Filled 2020-06-27: qty 2

## 2020-06-27 MED ORDER — LORATADINE 10 MG PO TABS
10.0000 mg | ORAL_TABLET | Freq: Every day | ORAL | Status: DC
  Administered 2020-06-28 – 2020-07-01 (×3): 10 mg via ORAL
  Filled 2020-06-27 (×3): qty 1

## 2020-06-27 MED ORDER — LEVOTHYROXINE SODIUM 50 MCG PO TABS
50.0000 ug | ORAL_TABLET | Freq: Every day | ORAL | Status: DC
  Administered 2020-06-28 – 2020-07-01 (×3): 50 ug via ORAL
  Filled 2020-06-27 (×3): qty 1

## 2020-06-27 MED ORDER — NITROGLYCERIN 0.4 MG SL SUBL
0.4000 mg | SUBLINGUAL_TABLET | Freq: Once | SUBLINGUAL | Status: AC
  Administered 2020-06-27: 0.4 mg via SUBLINGUAL
  Filled 2020-06-27: qty 1

## 2020-06-27 MED ORDER — LISINOPRIL 20 MG PO TABS
20.0000 mg | ORAL_TABLET | Freq: Every day | ORAL | Status: DC
  Administered 2020-06-28: 20 mg via ORAL
  Filled 2020-06-27: qty 1

## 2020-06-27 MED ORDER — NITROGLYCERIN 0.4 MG SL SUBL: 0.4000 mg | SUBLINGUAL_TABLET | SUBLINGUAL | Status: DC | PRN

## 2020-06-27 MED ORDER — ASPIRIN 81 MG PO CHEW
324.0000 mg | CHEWABLE_TABLET | Freq: Once | ORAL | Status: AC
  Administered 2020-06-27: 324 mg via ORAL
  Filled 2020-06-27: qty 4

## 2020-06-27 MED ORDER — CARVEDILOL 6.25 MG PO TABS
6.2500 mg | ORAL_TABLET | Freq: Two times a day (BID) | ORAL | Status: DC
  Administered 2020-06-27 – 2020-07-01 (×7): 6.25 mg via ORAL
  Filled 2020-06-27 (×7): qty 1

## 2020-06-27 MED ORDER — ASPIRIN EC 81 MG PO TBEC
81.0000 mg | DELAYED_RELEASE_TABLET | Freq: Every day | ORAL | Status: DC
  Administered 2020-06-28 – 2020-07-01 (×3): 81 mg via ORAL
  Filled 2020-06-27 (×3): qty 1

## 2020-06-27 MED ORDER — ALPRAZOLAM 0.25 MG PO TABS
0.2500 mg | ORAL_TABLET | Freq: Two times a day (BID) | ORAL | Status: DC | PRN
  Administered 2020-06-28: 0.25 mg via ORAL
  Filled 2020-06-27: qty 1

## 2020-06-27 MED ORDER — HEPARIN SODIUM (PORCINE) 5000 UNIT/ML IJ SOLN: 4000.0000 [IU] | Freq: Once | INTRAMUSCULAR | Status: DC

## 2020-06-27 MED ORDER — HEPARIN BOLUS VIA INFUSION
4000.0000 [IU] | Freq: Once | INTRAVENOUS | Status: AC
  Administered 2020-06-27: 4000 [IU] via INTRAVENOUS
  Filled 2020-06-27: qty 4000

## 2020-06-27 MED ORDER — HEPARIN (PORCINE) 25000 UT/250ML-% IV SOLN
750.0000 [IU]/h | INTRAVENOUS | Status: DC
  Administered 2020-06-27: 850 [IU]/h via INTRAVENOUS
  Administered 2020-06-29: 750 [IU]/h via INTRAVENOUS
  Filled 2020-06-27 (×2): qty 250

## 2020-06-27 MED ORDER — ONDANSETRON HCL 4 MG/2ML IJ SOLN: 4.0000 mg | Freq: Four times a day (QID) | INTRAMUSCULAR | Status: DC | PRN

## 2020-06-27 MED ORDER — INSULIN ASPART 100 UNIT/ML ~~LOC~~ SOLN
0.0000 [IU] | SUBCUTANEOUS | Status: DC
  Administered 2020-06-27: 1 [IU] via SUBCUTANEOUS
  Administered 2020-06-28: 2 [IU] via SUBCUTANEOUS
  Administered 2020-06-28: 1 [IU] via SUBCUTANEOUS
  Filled 2020-06-27 (×3): qty 1

## 2020-06-27 MED ORDER — SODIUM CHLORIDE 0.9 % IV SOLN: 250.0000 mL | INTRAVENOUS | Status: DC | PRN

## 2020-06-27 MED ORDER — SODIUM CHLORIDE 0.9% FLUSH
3.0000 mL | Freq: Two times a day (BID) | INTRAVENOUS | Status: DC
  Administered 2020-06-27 – 2020-06-29 (×4): 3 mL via INTRAVENOUS

## 2020-06-27 MED ORDER — PNEUMOCOCCAL VAC POLYVALENT 25 MCG/0.5ML IJ INJ: 0.5000 mL | INJECTION | INTRAMUSCULAR | Status: DC

## 2020-06-27 MED ORDER — INFLUENZA VAC A&B SA ADJ QUAD 0.5 ML IM PRSY
0.5000 mL | PREFILLED_SYRINGE | INTRAMUSCULAR | Status: DC
  Filled 2020-06-27: qty 0.5

## 2020-06-27 NOTE — Telephone Encounter (Signed)
Thank you for making me aware. Agree with recommendation for clinic visit and bringing BP cuff.  Loel Dubonnet, NP

## 2020-06-27 NOTE — ED Provider Notes (Signed)
[facility] Emergency Department Provider Note  Time seen: 7:46 PM  I have reviewed the triage vital signs and the nursing notes.   HISTORY  Chief Complaint Chest Pain and Weakness  HPI Stacy Newman is a 74 y.o. female with a past medical history of diabetes, hypertension, hyperlipidemia, CHF, presents to the emergency department for chest pain.  According to the patient around 5 PM tonight she developed a mild to moderate tingling sensation in her left chest along with nausea and diaphoresis.  States the nausea and diaphoresis have resolved continues have mild tingling in the left chest.  Denies any shortness of breath cough or fever.  No pleuritic pain.  Has noticed some mild lower extremity swelling which she has been taking a fluid pill for.  Patient sees Dr. Saunders Revel for cardiology.   Past Medical History:  Diagnosis Date  . Cancer (HCC)    BREAST  . Diabetes mellitus without complication (HCC)    TYPE 11  . Hypercholesterolemia   . Hypertension   . Hypothyroidism     Patient Active Problem List   Diagnosis Date Noted  . Chronic combined systolic and diastolic congestive heart failure ([place]) 11/23/2018  . CAD in native artery 11/23/2018  . Essential hypertension 11/23/2018  . Hyperlipidemia LDL goal <70 11/23/2018  . Chronic HFrEF (heart failure with reduced ejection fraction) ([place])   . Nonischemic cardiomyopathy ([place]) 02/23/2018    Past Surgical History:  Procedure Laterality Date  . BREAST SURGERY  2001   LUMPECTOMY LEFT SIDE  . CARDIAC CATHETERIZATION    . CATARACT EXTRACTION W/PHACO Right 03/31/2015   Procedure: CATARACT EXTRACTION PHACO AND INTRAOCULAR LENS PLACEMENT (IOC);  Surgeon: Ronnell Freshwater, MD;  Location: [place];  Service: Ophthalmology;  Laterality: Right;  DIABETIC-ORAL MEDS  . CATARACT EXTRACTION W/PHACO Left 12/29/2015   Procedure: CATARACT EXTRACTION PHACO AND INTRAOCULAR LENS PLACEMENT (IOC);   Surgeon: Ronnell Freshwater, MD;  Location: [place];  Service: Ophthalmology;  Laterality: Left;  DIABETIC - oral meds LEAVE PT AT 830 ARRIVAL TIME  . RIGHT/LEFT HEART CATH AND CORONARY ANGIOGRAPHY Bilateral 02/28/2018   Procedure: RIGHT/LEFT HEART CATH AND CORONARY ANGIOGRAPHY;  Surgeon: Nelva Bush, MD;  Location: [place] [facility];  Service: Cardiovascular;  Laterality: Bilateral;    Prior to Admission medications   Medication Sig Start Date End Date Taking? Authorizing Provider  aspirin 81 MG tablet Take 81 mg by mouth daily. LUNCH    [provider]  carvedilol (COREG) 6.25 MG tablet Take 1 tablet (6.25 mg total) by mouth 2 (two) times daily. 02/22/18   End, Harrell Gave, MD  cetirizine (ZYRTEC) 10 MG tablet Take 10 mg by mouth daily as needed.  09/04/18   [provider]  Cyanocobalamin (VITAMIN B12) 3000 MCG/ML LIQD Place 1 Dose under the tongue daily.     [provider]  furosemide (LASIX) 40 MG tablet Take 1 tablet (40 mg total) by mouth daily as needed. For swelling or weight gain. 03/31/18 01/29/29  End, Harrell Gave, MD  glipiZIDE (GLUCOTROL) 5 MG tablet Take by mouth daily. BEDTIME    [provider]  hydrochlorothiazide (HYDRODIURIL) 25 MG tablet Take by mouth. 04/07/20   [provider]  levothyroxine (SYNTHROID, LEVOTHROID) 50 MCG tablet Take 50 mcg by mouth daily before breakfast.    [provider]  lisinopril (ZESTRIL) 20 MG tablet Take 1 tablet (20 mg total) by mouth daily. 01/30/20 04/29/20  End, Harrell Gave, MD  metFORMIN (GLUCOPHAGE) 500 MG  tablet Take 500 mg by mouth daily. LUNCH    [provider]  Multiple Vitamins-Minerals (MULTIVITAL PO) Take 1 tablet by mouth every other day.    [provider]  potassium chloride (K-DUR) 10 MEQ tablet Take 10 mEq by mouth daily. LUNCH    [provider]  simvastatin (ZOCOR) 40 MG tablet Take 40 mg by mouth daily. Reported on 12/29/2015     [provider]  Vitamin D, Ergocalciferol, (DRISDOL) 50000 UNITS CAPS capsule Take 50,000 Units by mouth every 7 (seven) days.    [provider]    Allergies  Allergen Reactions  . Food Color Red [Red Dye] Swelling    Family History  Problem Relation Age of Onset  . Alzheimer's disease Mother   . Lung cancer Father     Social History Social History   Tobacco Use  . Smoking status: Never Smoker  . Smokeless tobacco: Never Used  Vaping Use  . Vaping Use: Never used  Substance Use Topics  . Alcohol use: No  . Drug use: Never    Review of Systems Constitutional: Negative for fever. Cardiovascular: Mild to moderate left-sided chest discomfort/tingling, minimal currently. Respiratory: Negative for shortness of breath. Gastrointestinal: Negative for abdominal pain, vomiting.  Positive for nausea earlier today now resolved Musculoskeletal: Mild lower extremity swelling Skin: Diaphoresis earlier tonight, now resolved Neurological: Negative for headache All other ROS negative  ____________________________________________   PHYSICAL EXAM:  VITAL SIGNS: ED Triage Vitals  Enc Vitals Group     BP 06/27/20 1831 (!) 146/89     Pulse Rate 06/27/20 1831 91     Resp 06/27/20 1831 18     Temp 06/27/20 1831 98.4 F (36.9 C)     Temp Source 06/27/20 1831 Oral     SpO2 06/27/20 1831 99 %     Weight 06/27/20 1831 175 lb (79.4 kg)     Height 06/27/20 1831 5\' 4"  (1.626 m)     Head Circumference --      Peak Flow --      Pain Score 06/27/20 1835 4     Pain Loc --      Pain Edu? --      Excl. in [place]? --     Constitutional: Alert and oriented. Well appearing and in no distress. Eyes: Normal exam ENT      Head: Normocephalic and atraumatic.      Mouth/Throat: Mucous membranes are moist. Cardiovascular: Normal rate, regular rhythm Respiratory: Normal respiratory effort without tachypnea nor retractions. Breath sounds are clear  Gastrointestinal: Soft and  nontender. No distention. Musculoskeletal: Nontender with normal range of motion in all extremities.  Mild lower extremity edema.  No calf tenderness. Neurologic:  Normal speech and language. No gross focal neurologic deficits  Skin:  Skin is warm, dry and intact.  Psychiatric: Mood and affect are normal.  ____________________________________________    EKG  EKG viewed and interpreted by myself shows a normal sinus rhythm at 91 bpm with a narrow QRS, slight left axis deviation, largely normal intervals nonspecific ST changes.  Does not appear to meet STEMI criteria.  Repeat EKG viewed and interpreted by myself shows a sinus rhythm at 91 bpm with a narrow QRS, normal axis, largely normal intervals with nonspecific ST changes.  ____________________________________________    RADIOLOGY  Chest x-ray shows possible small right pleural effusion otherwise negative.  ____________________________________________   INITIAL IMPRESSION / ASSESSMENT AND PLAN / ED COURSE  Pertinent labs & imaging results that were  available during my care of the patient were reviewed by me and considered in my medical decision making (see chart for details).   Patient presents to the emergency department for chest pain discomfort nausea and diaphoresis around 5 PM largely resolved at this time which is mild tingling in the left chest currently.  Overall patient appears well, no distress.  Does have slight lower extremity edema but takes Lasix.  Sees Dr. Saunders Revel of cardiology.  Patient's initial EKG shows somewhat concerning morphology in the inferior leads but does not meet STEMI criteria.  Repeat EKG also does not meet STEMI criteria.  However given the elevated troponin of 2529 I spoke to Dr. Clayborn Bigness who is on-call for STEMI.  He has reviewed the EKGs and believes the patient should be medically managed at this time with heparin nitroglycerin as needed aspirin.  We will bolus 4000 units of heparin and started on a  heparin infusion.  Patient will require admission to the hospital service for NSTEMI.  Daveena Jazzie Trampe was evaluated in Emergency Department on 06/27/2020 for the symptoms described in the history of present illness. She was evaluated in the context of the global COVID-19 pandemic, which necessitated consideration that the patient might be at risk for infection with the SARS-CoV-2 virus that causes COVID-19. Institutional protocols and algorithms that pertain to the evaluation of patients at risk for COVID-19 are in a state of rapid change based on information released by regulatory bodies including the CDC and federal and state organizations. These policies and algorithms were followed during the patient's care in the ED.  CRITICAL CARE Performed by: Harvest Dark   Total critical care time: 30 minutes  Critical care time was exclusive of separately billable procedures and treating other patients.  Critical care was necessary to treat or prevent imminent or life-threatening deterioration.  Critical care was time spent personally by me on the following activities: development of treatment plan with patient and/or surrogate as well as nursing, discussions with consultants, evaluation of patient's response to treatment, examination of patient, obtaining history from patient or surrogate, ordering and performing treatments and interventions, ordering and review of laboratory studies, ordering and review of radiographic studies, pulse oximetry and re-evaluation of patient's condition.  ____________________________________________   FINAL CLINICAL IMPRESSION(S) / ED DIAGNOSES  NSTEMI   Harvest Dark, MD 06/27/20 2000

## 2020-06-27 NOTE — ED Notes (Signed)
Called lab regarding blood work that was added on. Asked this to run bloodwork on tubes that were already sent.

## 2020-06-27 NOTE — ED Notes (Signed)
Dr. Paduchowski at bedside.  

## 2020-06-27 NOTE — Plan of Care (Signed)
  Problem: Education: Goal: Knowledge of General Education information will improve Description: Including pain rating scale, medication(s)/side effects and non-pharmacologic comfort measures Outcome: Progressing   Problem: Health Behavior/Discharge Planning: Goal: Ability to manage health-related needs will improve Outcome: Progressing   Problem: Clinical Measurements: Goal: Ability to maintain clinical measurements within normal limits will improve Outcome: Progressing Goal: Will remain free from infection Outcome: Progressing Goal: Diagnostic test results will improve Outcome: Progressing Goal: Respiratory complications will improve Outcome: Progressing Goal: Cardiovascular complication will be avoided Outcome: Progressing   Problem: Activity: Goal: Risk for activity intolerance will decrease Outcome: Progressing   Problem: Nutrition: Goal: Adequate nutrition will be maintained Outcome: Progressing   Problem: Coping: Goal: Level of anxiety will decrease Outcome: Progressing   Problem: Elimination: Goal: Will not experience complications related to bowel motility Outcome: Progressing Goal: Will not experience complications related to urinary retention Outcome: Progressing   Problem: Pain Managment: Goal: General experience of comfort will improve Outcome: Progressing   Problem: Safety: Goal: Ability to remain free from injury will improve Outcome: Progressing   Problem: Skin Integrity: Goal: Risk for impaired skin integrity will decrease Outcome: Progressing   Problem: Education: Goal: Ability to demonstrate management of disease process will improve Outcome: Progressing Goal: Ability to verbalize understanding of medication therapies will improve Outcome: Progressing Goal: Individualized Educational Video(s) Outcome: Progressing   Problem: Activity: Goal: Capacity to carry out activities will improve Outcome: Progressing   Problem: Cardiac: Goal:  Ability to achieve and maintain adequate cardiopulmonary perfusion will improve Outcome: Progressing   Problem: Education: Goal: Ability to describe self-care measures that may prevent or decrease complications (Diabetes Survival Skills Education) will improve Outcome: Progressing Goal: Individualized Educational Video(s) Outcome: Progressing   Problem: Coping: Goal: Ability to adjust to condition or change in health will improve Outcome: Progressing   Problem: Fluid Volume: Goal: Ability to maintain a balanced intake and output will improve Outcome: Progressing   Problem: Health Behavior/Discharge Planning: Goal: Ability to identify and utilize available resources and services will improve Outcome: Progressing Goal: Ability to manage health-related needs will improve Outcome: Progressing   Problem: Metabolic: Goal: Ability to maintain appropriate glucose levels will improve Outcome: Progressing   Problem: Nutritional: Goal: Maintenance of adequate nutrition will improve Outcome: Progressing Goal: Progress toward achieving an optimal weight will improve Outcome: Progressing   Problem: Skin Integrity: Goal: Risk for impaired skin integrity will decrease Outcome: Progressing   Problem: Tissue Perfusion: Goal: Adequacy of tissue perfusion will improve Outcome: Progressing   Problem: Education: Goal: Understanding of cardiac disease, CV risk reduction, and recovery process will improve Outcome: Progressing Goal: Understanding of medication regimen will improve Outcome: Progressing Goal: Individualized Educational Video(s) Outcome: Progressing   Problem: Activity: Goal: Ability to tolerate increased activity will improve Outcome: Progressing   Problem: Cardiac: Goal: Ability to achieve and maintain adequate cardiopulmonary perfusion will improve Outcome: Progressing Goal: Vascular access site(s) Level 0-1 will be maintained Outcome: Progressing   Problem: Health  Behavior/Discharge Planning: Goal: Ability to safely manage health-related needs after discharge will improve Outcome: Progressing

## 2020-06-27 NOTE — Consult Note (Signed)
Stacy Newman for Heparin  Indication: ACS / STEMI  Allergies  Allergen Reactions  . Food Color Red [Red Dye] Swelling    Patient Measurements: Height: 5\' 4"  (162.6 cm) Weight: 79.4 kg (175 lb) IBW/kg (Calculated) : 54.7 Heparin Dosing Weight: 71.7 kg   Vital Signs: Temp: 98.4 F (36.9 C) (11/12 1831) Temp Source: Oral (11/12 1831) BP: 146/89 (11/12 1831) Pulse Rate: 92 (11/12 1945)  Labs: Recent Labs    06/27/20 1838  HGB 9.3*  HCT 27.2*  PLT 278  CREATININE 0.87  TROPONINIHS 2,529*    Estimated Creatinine Clearance: 59.6 mL/min (by C-G formula based on SCr of 0.87 mg/dL).   Medications:  Confirmed with patient no PTA anticoagulants   Assessment: Pharmacy consulted for heparin dosing in a 30 YOF with presented with chest pain. Baseline troponin 2529 and CBC appropriate to start heparin with a bolus. Will watch Hgb while patient is on heparin.    Goal of Therapy:  Heparin level 0.3-0.7 units/ml Monitor platelets by anticoagulation protocol: Yes   Plan:  Baseline labs have been ordered  Heparin DW:  71.7 kg  Give 4000 units bolus x 1 Start heparin infusion at 850 units/hr Check anti-Xa level in 8 hours and daily while on heparin, per protocol Continue to monitor H&H and platelets daily while on heparin   Andra Heslin R Sparrow [place] 06/27/2020,8:03 PM

## 2020-06-27 NOTE — ED Triage Notes (Signed)
Pt presents to ED via POV with c/o sudden onset L sided CP that started today. Pt denies radiation at this time. Pt A&O x4. Pt also c/o general malaise at this time. Pt states set an appt with cardiologist earlier today then began having CP after speaking with him. Pt describes CP as a tingling. Pt states cardiac hx at this time, states has been taking her fluid pill as prescribed.

## 2020-06-27 NOTE — Telephone Encounter (Signed)
Patient calling in complaining of weakness, states she feels like a mack truck. Patient states she does not have chest pain, some dizziness this morning  Patient would like to be seen if nurse feels it appropriate

## 2020-06-27 NOTE — Telephone Encounter (Signed)
Spoke to patient. She reports she's been working "too much" and is burnt out and stressed out. She takes her lisinopril and carvedilol at night time before bed. Over the past days, her BP has been more elevated in the morning when she checks it ( I.e. 150/93). In the morning, she feels tired and weak as she starting her day. She says she needs to rest and I encouraged her to do so. No chest pain, shortness of breath, dizziness.  Scheduled patient to come in on Monday morning. Asked her to bring her BP readings, BP cuff and medications with her.

## 2020-06-27 NOTE — H&P (Signed)
Stacy Newman IHK:[id number] DOB: June 07, 1946 DOA: 06/27/2020     PCP: Lonn Georgia, FNP   Outpatient Specialists:   CARDS:  Dr. Saunders Revel    Patient arrived to ER on 06/27/20 at 1818 Referred by Attending Harvest Dark, MD   Patient coming from: home Lives alone,    With family across the street   Chief Complaint:  Chief Complaint  Patient presents with  . Chest Pain  . Weakness    HPI: Stacy Newman is a 74 y.o. female with medical history significant of chronic systolic heart failure due to nonischemic cardiomyopathy, hypertension, hyperlipidemia, type 2 diabetes mellitus, hypothyroidism, and remote breast cancer status post lumpectomy and chemoradiation     Presented with   have been feeling generalized fatigue and today she developed sudden onset left-sided chest pain did not radiate still continues have generalized malaise Before she was having chest pain she called her cardiologist and schedule appointment for Monday but then she developed chest pain thereafter.  Describes it as tingling Pain started around 5 PM.  Chest pain started and patient was coming back from work describes a moderate.  Associated with some nausea and diaphoresis which has now resolved.  No associated shortness of breath cough or fever no pleuritic pain. Lately her blood pressure has been elevated she has been taking her lisinopril and carvedilol. Currently chest discomfort down to mild tingling No pleuritic chest pain  Infectious risk factors:  Reports , severe fatigue    Has   been vaccinated against COVID and boosted 2 weeks ago   Initial COVID TEST   in house  PCR testing  Pending  No results found for: SARSCOV2NAA   Regarding pertinent Chronic problems    Hyperlipidemia -  on statins Zocor    HTN on carvedilol lisinopril hydrochlorothiazide   chronic CHF  systolic  - last echo may 2021  LVEF to 40-45% (previously 35-40% in 02/2018). Lasix    DM 2 -   PO meds only    Hypothyroidism:  on synthroid    Chronic anemia - baseline hg Hemoglobin & Hematocrit  Recent Labs    06/27/20 1838  HGB 9.3*    While in ER: Noted to have significant troponin elevation elevated troponin of 2529 Hemoglobin down to 9.3 EKG nonischemic no STEMI but nonspecific ST changes. Chest x-ray unremarkable except for small pleural effusion ER Provider Called:   Cardiology Dr. Clayborn Bigness They Recommend admit to medicine heparin nitroglycerin as needed and aspirin Will see in AM   Hospitalist was called for admission for  NSTEMI  The following Work up has been ordered so far:  Orders Placed This Encounter  Procedures  . Respiratory Panel by RT PCR (Flu A&B, Covid) - Nasopharyngeal Swab  . DG Chest 2 View  . Basic metabolic panel  . CBC  . Protime-INR  . APTT  . Heparin level (unfractionated)  . CBC  . Document Height and Actual Weight  . heparin per pharmacy consult  . heparin per pharmacy consult  . Consult to hospitalist  ALL PATIENTS BEING ADMITTED/HAVING PROCEDURES NEED COVID-19 SCREENING  . EKG 12-Lead  . EKG 12-Lead  . ED EKG     Following Medications were ordered in ER: Medications  heparin ADULT infusion 100 units/mL (25000 units/267mL sodium chloride 0.45%) (850 Units/hr Intravenous New Bag/Given 06/27/20 2050)  nitroGLYCERIN (NITROSTAT) SL tablet 0.4 mg (has no administration in time range)  aspirin chewable tablet 324 mg (324 mg Oral Given 06/27/20 2050)  heparin bolus via infusion 4,000 Units (4,000 Units Intravenous Bolus from Bag 06/27/20 2045)        Consult Orders  (From admission, onward)         Start     Ordered   06/27/20 2042  Consult to hospitalist  ALL PATIENTS BEING ADMITTED/HAVING PROCEDURES NEED COVID-19 SCREENING  Once       Comments: ALL PATIENTS BEING ADMITTED/HAVING PROCEDURES NEED COVID-19 SCREENING  Provider:  (Not yet assigned)  Question Answer Comment  Place call to: [facility]   Reason for Consult Admit    Diagnosis/Clinical Info for Consult: nstemi      06/27/20 2041          Significant initial  Findings: Abnormal Labs Reviewed  BASIC METABOLIC PANEL - Abnormal; Notable for the following components:      Result Value   Sodium 134 (*)    Potassium 3.4 (*)    Glucose, Bld 157 (*)    Calcium 8.4 (*)    All other components within normal limits  CBC - Abnormal; Notable for the following components:   WBC 3.6 (*)    RBC 3.26 (*)    Hemoglobin 9.3 (*)    HCT 27.2 (*)    All other components within normal limits  TROPONIN I (HIGH SENSITIVITY) - Abnormal; Notable for the following components:   Troponin I (High Sensitivity) 2,529 (*)    All other components within normal limits    Otherwise labs showing:    Recent Labs  Lab 06/27/20 1838  NA 134*  K 3.4*  CO2 23  GLUCOSE 157*  BUN 21  CREATININE 0.87  CALCIUM 8.4*    Cr   stable,    Lab Results  Component Value Date   CREATININE 0.87 06/27/2020   CREATININE 0.86 01/30/2020   CREATININE 0.89 07/26/2019    No results for input(s): AST, ALT, ALKPHOS, BILITOT, PROT, ALBUMIN in the last 168 hours. Lab Results  Component Value Date   CALCIUM 8.4 (L) 06/27/2020        WBC      Component Value Date/Time   WBC 3.6 (L) 06/27/2020 1838   LYMPHSABS 1.4 02/23/2018 1254   MONOABS 0.6 02/23/2018 1254   EOSABS 0.1 02/23/2018 1254   BASOSABS 0.0 02/23/2018 1254    Plt: Lab Results  Component Value Date   PLT 278 06/27/2020       HG/HCT  Down from baseline    Component Value Date/Time   HGB 9.3 (L) 06/27/2020 1838   HCT 27.2 (L) 06/27/2020 1838   MCV 83.4 06/27/2020 1838  Hg 9.7 in December 2018    Troponin 2529   ECG: Ordered Personally reviewed by me showing: HR : 91 Rhythm:  NSR   nonspecific changes,  QTC 445     CXR -  NON acute mild plural effusion     ED Triage Vitals  Enc Vitals Group     BP 06/27/20 1831 (!) 146/89     Pulse Rate 06/27/20 1831 91     Resp 06/27/20 1831 18     Temp  06/27/20 1831 98.4 F (36.9 C)     Temp Source 06/27/20 1831 Oral     SpO2 06/27/20 1831 99 %     Weight 06/27/20 1831 175 lb (79.4 kg)     Height 06/27/20 1831 5\' 4"  (1.626 m)     Head Circumference --      Peak Flow --      Pain Score  06/27/20 1835 4     Pain Loc --      Pain Edu? --      Excl. in [place]? --   TMAX(24)@       Latest  Blood pressure (!) 146/91, pulse 87, temperature 98.4 F (36.9 C), temperature source Oral, resp. rate (!) 26, height 5\' 4"  (1.626 m), weight 79.4 kg, SpO2 100 %.      Review of Systems:    Pertinent positives include:   Fatigue chest pain,  Constitutional:  No weight loss, night sweats, Fevers, chills,, weight loss  HEENT:  No headaches, Difficulty swallowing,Tooth/dental problems,Sore throat,  No sneezing, itching, ear ache, nasal congestion, post nasal drip,  Cardio-vascular:  No  Orthopnea, PND, anasarca, dizziness, palpitations.no Bilateral lower extremity swelling  GI:  No heartburn, indigestion, abdominal pain, nausea, vomiting, diarrhea, change in bowel habits, loss of appetite, melena, blood in stool, hematemesis Resp:  no shortness of breath at rest. No dyspnea on exertion, No excess mucus, no productive cough, No non-productive cough, No coughing up of blood.No change in color of mucus.No wheezing. Skin:  no rash or lesions. No jaundice GU:  no dysuria, change in color of urine, no urgency or frequency. No straining to urinate.  No flank pain.  Musculoskeletal:  No joint pain or no joint swelling. No decreased range of motion. No back pain.  Psych:  No change in mood or affect. No depression or anxiety. No memory loss.  Neuro: no localizing neurological complaints, no tingling, no weakness, no double vision, no gait abnormality, no slurred speech, no confusion  All systems reviewed and apart from [place] all are negative  Past Medical History:   Past Medical History:  Diagnosis Date  . Cancer (HCC)    BREAST  . Diabetes mellitus  without complication (HCC)    TYPE 11  . Hypercholesterolemia   . Hypertension   . Hypothyroidism       Past Surgical History:  Procedure Laterality Date  . BREAST SURGERY  2001   LUMPECTOMY LEFT SIDE  . CARDIAC CATHETERIZATION    . CATARACT EXTRACTION W/PHACO Right 03/31/2015   Procedure: CATARACT EXTRACTION PHACO AND INTRAOCULAR LENS PLACEMENT (IOC);  Surgeon: Ronnell Freshwater, MD;  Location: [facility];  Service: Ophthalmology;  Laterality: Right;  DIABETIC-ORAL MEDS  . CATARACT EXTRACTION W/PHACO Left 12/29/2015   Procedure: CATARACT EXTRACTION PHACO AND INTRAOCULAR LENS PLACEMENT (IOC);  Surgeon: Ronnell Freshwater, MD;  Location: [facility];  Service: Ophthalmology;  Laterality: Left;  DIABETIC - oral meds LEAVE PT AT 830 ARRIVAL TIME  . RIGHT/LEFT HEART CATH AND CORONARY ANGIOGRAPHY Bilateral 02/28/2018   Procedure: RIGHT/LEFT HEART CATH AND CORONARY ANGIOGRAPHY;  Surgeon: Nelva Bush, MD;  Location: [facility];  Service: Cardiovascular;  Laterality: Bilateral;    Social History:  Ambulatory independently     reports that she has never smoked. She has never used smokeless tobacco. She reports that she does not drink alcohol and does not use drugs.  Family History:   Family History  Problem Relation Age of Onset  . Alzheimer's disease Mother   . Lung cancer Father     Allergies: Allergies  Allergen Reactions  . Food Color Red [Red Dye] Swelling     Prior to Admission medications   Medication Sig Start Date End Date Taking? Authorizing Provider  aspirin 81 MG tablet Take 81 mg by mouth daily. LUNCH    [provider]  carvedilol (COREG) 6.25 MG tablet Take 1 tablet (  6.25 mg total) by mouth 2 (two) times daily. 02/22/18   End, Harrell Gave, MD  cetirizine (ZYRTEC) 10 MG tablet Take 10 mg by mouth daily as needed.  09/04/18   [provider]  Cyanocobalamin (VITAMIN B12) 3000 MCG/ML LIQD Place 1 Dose under  the tongue daily.     [provider]  furosemide (LASIX) 40 MG tablet Take 1 tablet (40 mg total) by mouth daily as needed. For swelling or weight gain. 03/31/18 01/29/29  End, Harrell Gave, MD  glipiZIDE (GLUCOTROL) 5 MG tablet Take by mouth daily. BEDTIME    [provider]  hydrochlorothiazide (HYDRODIURIL) 25 MG tablet Take by mouth. 04/07/20   [provider]  levothyroxine (SYNTHROID, LEVOTHROID) 50 MCG tablet Take 50 mcg by mouth daily before breakfast.    [provider]  lisinopril (ZESTRIL) 20 MG tablet Take 1 tablet (20 mg total) by mouth daily. 01/30/20 04/29/20  End, Harrell Gave, MD  metFORMIN (GLUCOPHAGE) 500 MG tablet Take 500 mg by mouth daily. LUNCH    [provider]  Multiple Vitamins-Minerals (MULTIVITAL PO) Take 1 tablet by mouth every other day.    [provider]  potassium chloride (K-DUR) 10 MEQ tablet Take 10 mEq by mouth daily. LUNCH    [provider]  simvastatin (ZOCOR) 40 MG tablet Take 40 mg by mouth daily. Reported on 12/29/2015    [provider]  Vitamin D, Ergocalciferol, (DRISDOL) 50000 UNITS CAPS capsule Take 50,000 Units by mouth every 7 (seven) days.    [provider]   Physical Exam: Vitals with BMI 06/27/2020 06/27/2020 06/27/2020  Height - - -  Weight - - -  BMI - - -  Systolic - 559 -  Diastolic - 91 -  Pulse 87 86 92     1. General:  in No Acute distress    Chronically ill -appearing 2. Psychological: Alert and    Oriented 3. Head/ENT:     Dry Mucous Membranes                          Head Non traumatic, neck supple                          Poor Dentition 4. SKIN:  decreased Skin turgor,  Skin clean Dry and intact no rash 5. Heart: Regular rate and rhythm no  Murmur, no Rub or gallop 6. Lungs:   no wheezes or crackles   7. Abdomen: Soft,  non-tender, Non distended  bowel sounds present 8. Lower extremities: no clubbing, cyanosis, no  edema 9. Neurologically  Grossly intact, moving all 4 extremities equally   10. MSK: Normal range of motion   All other LABS:     Recent Labs  Lab 06/27/20 1838  WBC 3.6*  HGB 9.3*  HCT 27.2*  MCV 83.4  PLT 278     Recent Labs  Lab 06/27/20 1838  NA 134*  K 3.4*  CL 98  CO2 23  GLUCOSE 157*  BUN 21  CREATININE 0.87  CALCIUM 8.4*     No results for input(s): AST, ALT, ALKPHOS, BILITOT, PROT, ALBUMIN in the last 168 hours.     Cultures: No results found for: SDES, SPECREQUEST, CULT, REPTSTATUS   Radiological Exams on Admission: DG Chest 2 View  Result Date: 06/27/2020 CLINICAL DATA:  74 year old female with chest pain. EXAM: CHEST - 2 VIEW COMPARISON:  None. FINDINGS: There is diffuse chronic  interstitial coarsening. A small right pleural effusion is suspected. No focal consolidation or pneumothorax. Mild cardiomegaly. Atherosclerotic calcification of the aorta. No acute osseous pathology. IMPRESSION: Probable small right pleural effusion.  No focal consolidation. Electronically Signed   By: Anner Crete M.D.   On: 06/27/2020 19:21    Chart has been reviewed    Assessment/Plan  74 y.o. female with medical history significant of chronic systolic heart failure due to nonischemic cardiomyopathy, hypertension, hyperlipidemia, type 2 diabetes mellitus, hypothyroidism, and remote breast cancer status post lumpectomy and chemoradiation    Admitted for NSTEMI  Present on Admission: . NSTEMI (non-ST elevated myocardial infarction) (HCC)-initiated heparin drip Aspirin Nitroglycerin as needed Keep n.p.o. post-midnight Cardiology consulted for further work-up and possible cardiac catheterization in a.m.  Marland Kitchen Hyperlipidemia LDL goal <70 -continue statin check lipid panel   . Essential hypertension -stable continue home meds  . Chronic combined systolic and diastolic congestive heart failure (HCC) -appears to be clinically close to euvolemia  . CAD in native artery -on statin beta-blocker  and aspirin appreciate cardiology input  . DM (diabetes mellitus), type 2 with complications (HCC) -  - Order Sensitive SSI    -  check TSH and HgA1C  - Hold by mouth medications   History of breast cancer remote Currently in remission  Anemia -chronic but somewhat worse from baseline.  Obtain anemia panel Hemoccult stool We will need further evaluation  Hypokalemia - - will replace and repeat in AM,  check magnesium level and replace as needed  Other plan as per orders.  DVT prophylaxis: Heparin drip      Code Status:    Code Status: Prior FULL CODE  as per patient   I had personally discussed CODE STATUS with patient     Family Communication:   Family not at  Bedside   Disposition Plan:     To home once workup is complete and patient is stable   Following barriers for discharge:                            Electrolytes corrected                               Anemia stable                             Pain controlled with PO medications                                                           Will need consultants to evaluate patient prior to discharge    Consults called: Cardiology Dr. Clayborn Bigness was notified  Admission status:  ED Disposition    ED Disposition Condition [place]: [place] [[id number]]  Level of Care: Progressive Cardiac [[id number]]  Admit to Progressive based on following criteria: CARDIOVASCULAR & THORACIC of moderate stability with acute coronary syndrome symptoms/low risk myocardial infarction/hypertensive urgency/arrhythmias/heart failure potentially compromising stability and stable post cardiovascular intervention patients.  Covid Evaluation: Asymptomatic Screening Protocol (No Symptoms)  Diagnosis: NSTEMI (non-ST elevated myocardial infarction) [facility]) [[id number]]  Admitting Physician: Toy Baker [[id number]]  Attending Physician: Roel Cluck  Stacy Newman [[id number]]  Estimated length of stay: 3 - 4 days  Certification:: I  certify this patient will need inpatient services for at least 2 midnights         inpatient     I Expect 2 midnight stay secondary to severity of patient's current illness need for inpatient interventions justified by the following:  hemodynamic instability despite optimal treatment (  tachypnea  )  Severe lab/radiological/exam abnormalities including:   NSTEMI and extensive comorbidities including:  DM2   CHF   CAD    malignancy,   That are currently affecting medical management.   I expect  patient to be hospitalized for 2 midnights requiring inpatient medical care.  Patient is at high risk for adverse outcome (such as loss of life or disability) if not treated.  Indication for inpatient stay as follows:      persistent chest pain despite medical management Need for operative/procedural  intervention    Need for IV nitroglycerin, IV anticoagulation    Level of care   progressive tele indefinitely please discontinue once patient no longer qualifies COVID-19 Labs   No results found for: SARSCOV2NAA   Precautions: admitted as  asymptomatic screening protocol   PPE: Used by the provider:   P100  eye Goggles,  Gloves        Stacy Newman 06/27/2020, 10:03 PM    [facility]     after 2 AM please page floor coverage PA If 7AM-7PM, please contact the day team taking care of the patient using [email or web address]   Patient was evaluated in the context of the global COVID-19 pandemic, which necessitated consideration that the patient might be at risk for infection with the SARS-CoV-2 virus that causes COVID-19. Institutional protocols and algorithms that pertain to the evaluation of patients at risk for COVID-19 are in a state of rapid change based on information released by regulatory bodies including the CDC and federal and state organizations. These policies and algorithms were followed during the patient's care.

## 2020-06-28 DIAGNOSIS — I428 Other cardiomyopathies: Secondary | ICD-10-CM | POA: Diagnosis not present

## 2020-06-28 DIAGNOSIS — E785 Hyperlipidemia, unspecified: Secondary | ICD-10-CM

## 2020-06-28 DIAGNOSIS — I5023 Acute on chronic systolic (congestive) heart failure: Secondary | ICD-10-CM

## 2020-06-28 DIAGNOSIS — I214 Non-ST elevation (NSTEMI) myocardial infarction: Secondary | ICD-10-CM | POA: Diagnosis not present

## 2020-06-28 DIAGNOSIS — I420 Dilated cardiomyopathy: Secondary | ICD-10-CM

## 2020-06-28 DIAGNOSIS — I200 Unstable angina: Secondary | ICD-10-CM

## 2020-06-28 DIAGNOSIS — E1169 Type 2 diabetes mellitus with other specified complication: Secondary | ICD-10-CM

## 2020-06-28 DIAGNOSIS — E039 Hypothyroidism, unspecified: Secondary | ICD-10-CM

## 2020-06-28 LAB — CBC
HCT: 27.1 % — ABNORMAL LOW (ref 36.0–46.0)
Hemoglobin: 9.6 g/dL — ABNORMAL LOW (ref 12.0–15.0)
MCH: 28.9 pg (ref 26.0–34.0)
MCHC: 35.4 g/dL (ref 30.0–36.0)
MCV: 81.6 fL (ref 80.0–100.0)
Platelets: 280 10*3/uL (ref 150–400)
RBC: 3.32 MIL/uL — ABNORMAL LOW (ref 3.87–5.11)
RDW: 14.4 % (ref 11.5–15.5)
WBC: 4.3 10*3/uL (ref 4.0–10.5)
nRBC: 0 % (ref 0.0–0.2)

## 2020-06-28 LAB — CBC WITH DIFFERENTIAL/PLATELET
Abs Immature Granulocytes: 0.01 10*3/uL (ref 0.00–0.07)
Basophils Absolute: 0 10*3/uL (ref 0.0–0.1)
Basophils Relative: 0 %
Eosinophils Absolute: 0 10*3/uL (ref 0.0–0.5)
Eosinophils Relative: 0 %
HCT: 26 % — ABNORMAL LOW (ref 36.0–46.0)
Hemoglobin: 9.2 g/dL — ABNORMAL LOW (ref 12.0–15.0)
Immature Granulocytes: 0 %
Lymphocytes Relative: 32 %
Lymphs Abs: 1.4 10*3/uL (ref 0.7–4.0)
MCH: 28.5 pg (ref 26.0–34.0)
MCHC: 35.4 g/dL (ref 30.0–36.0)
MCV: 80.5 fL (ref 80.0–100.0)
Monocytes Absolute: 0.4 10*3/uL (ref 0.1–1.0)
Monocytes Relative: 9 %
Neutro Abs: 2.5 10*3/uL (ref 1.7–7.7)
Neutrophils Relative %: 59 %
Platelets: 271 10*3/uL (ref 150–400)
RBC: 3.23 MIL/uL — ABNORMAL LOW (ref 3.87–5.11)
RDW: 14.1 % (ref 11.5–15.5)
WBC: 4.3 10*3/uL (ref 4.0–10.5)
nRBC: 0 % (ref 0.0–0.2)

## 2020-06-28 LAB — HEPATIC FUNCTION PANEL
ALT: 42 U/L (ref 0–44)
AST: 97 U/L — ABNORMAL HIGH (ref 15–41)
Albumin: 3.2 g/dL — ABNORMAL LOW (ref 3.5–5.0)
Alkaline Phosphatase: 78 U/L (ref 38–126)
Bilirubin, Direct: 0.1 mg/dL (ref 0.0–0.2)
Indirect Bilirubin: 0.8 mg/dL (ref 0.3–0.9)
Total Bilirubin: 0.9 mg/dL (ref 0.3–1.2)
Total Protein: 6.5 g/dL (ref 6.5–8.1)

## 2020-06-28 LAB — LIPID PANEL
Cholesterol: 162 mg/dL (ref 0–200)
HDL: 46 mg/dL (ref >40)
LDL Cholesterol: 106 mg/dL — ABNORMAL HIGH (ref 0–99)
Total CHOL/HDL Ratio: 3.5 RATIO
Triglycerides: 48 mg/dL (ref <150)
VLDL: 10 mg/dL (ref 0–40)

## 2020-06-28 LAB — IRON AND TIBC
Iron: 49 ug/dL (ref 28–170)
Saturation Ratios: 16 % (ref 10.4–31.8)
TIBC: 309 ug/dL (ref 250–450)
UIBC: 260 ug/dL

## 2020-06-28 LAB — BASIC METABOLIC PANEL
Anion gap: 10 (ref 5–15)
BUN: 19 mg/dL (ref 8–23)
CO2: 25 mmol/L (ref 22–32)
Calcium: 8.3 mg/dL — ABNORMAL LOW (ref 8.9–10.3)
Chloride: 101 mmol/L (ref 98–111)
Creatinine, Ser: 0.9 mg/dL (ref 0.44–1.00)
GFR, Estimated: >60 mL/min (ref >60)
Glucose, Bld: 133 mg/dL — ABNORMAL HIGH (ref 70–99)
Potassium: 3.3 mmol/L — ABNORMAL LOW (ref 3.5–5.1)
Sodium: 136 mmol/L (ref 135–145)

## 2020-06-28 LAB — GLUCOSE, CAPILLARY
Glucose-Capillary: 127 mg/dL — ABNORMAL HIGH (ref 70–99)
Glucose-Capillary: 151 mg/dL — ABNORMAL HIGH (ref 70–99)
Glucose-Capillary: 113 mg/dL — ABNORMAL HIGH (ref 70–99)
Glucose-Capillary: 99 mg/dL (ref 70–99)
Glucose-Capillary: 149 mg/dL — ABNORMAL HIGH (ref 70–99)

## 2020-06-28 LAB — HEPARIN LEVEL (UNFRACTIONATED)
Heparin Unfractionated: 0.78 IU/mL — ABNORMAL HIGH (ref 0.30–0.70)
Heparin Unfractionated: 0.6 IU/mL (ref 0.30–0.70)

## 2020-06-28 LAB — MAGNESIUM: Magnesium: 1.6 mg/dL — ABNORMAL LOW (ref 1.7–2.4)

## 2020-06-28 LAB — FOLATE: Folate: 14.4 ng/mL (ref >5.9)

## 2020-06-28 LAB — HEMOGLOBIN A1C
Hgb A1c MFr Bld: 6.2 % — ABNORMAL HIGH (ref 4.8–5.6)
Mean Plasma Glucose: 131.24 mg/dL

## 2020-06-28 LAB — BRAIN NATRIURETIC PEPTIDE: B Natriuretic Peptide: 1194.3 pg/mL — ABNORMAL HIGH (ref 0.0–100.0)

## 2020-06-28 LAB — RETICULOCYTES
Immature Retic Fract: 28.7 % — ABNORMAL HIGH (ref 2.3–15.9)
RBC.: 3.15 MIL/uL — ABNORMAL LOW (ref 3.87–5.11)
Retic Count, Absolute: 81.6 10*3/uL (ref 19.0–186.0)
Retic Ct Pct: 2.6 % (ref 0.4–3.1)

## 2020-06-28 LAB — FERRITIN: Ferritin: 124 ng/mL (ref 11–307)

## 2020-06-28 LAB — TROPONIN I (HIGH SENSITIVITY)
Troponin I (High Sensitivity): 15828 ng/L (ref <18)
Troponin I (High Sensitivity): 5685 ng/L (ref <18)

## 2020-06-28 LAB — PROTIME-INR
INR: 1.3 — ABNORMAL HIGH (ref 0.8–1.2)
Prothrombin Time: 15.6 seconds — ABNORMAL HIGH (ref 11.4–15.2)

## 2020-06-28 LAB — TSH: TSH: 2.481 u[IU]/mL (ref 0.350–4.500)

## 2020-06-28 LAB — VITAMIN B12: Vitamin B-12: 3470 pg/mL — ABNORMAL HIGH (ref 180–914)

## 2020-06-28 MED ORDER — FUROSEMIDE 40 MG PO TABS
40.0000 mg | ORAL_TABLET | Freq: Once | ORAL | Status: AC
  Administered 2020-06-28: 40 mg via ORAL
  Filled 2020-06-28: qty 1

## 2020-06-28 MED ORDER — LOSARTAN POTASSIUM 25 MG PO TABS
25.0000 mg | ORAL_TABLET | Freq: Every day | ORAL | Status: DC
  Administered 2020-06-28 – 2020-07-01 (×3): 25 mg via ORAL
  Filled 2020-06-28 (×3): qty 1

## 2020-06-28 MED ORDER — OXYCODONE HCL 5 MG PO TABS
5.0000 mg | ORAL_TABLET | Freq: Four times a day (QID) | ORAL | Status: DC | PRN
  Administered 2020-06-28 – 2020-06-29 (×2): 5 mg via ORAL
  Filled 2020-06-28 (×2): qty 1

## 2020-06-28 NOTE — Consult Note (Signed)
[place] for Heparin  Indication: ACS / STEMI  Allergies  Allergen Reactions  . Food Color Red [Red Dye] Swelling    Patient Measurements: Height: 5\' 4"  (162.6 cm) Weight: 77.9 kg (171 lb 11.8 oz) IBW/kg (Calculated) : 54.7 Heparin Dosing Weight: 71.7 kg   Vital Signs: Temp: 97.8 F (36.6 C) (11/13 0431) Temp Source: Oral (11/13 0431) BP: 127/80 (11/13 0431) Pulse Rate: 77 (11/13 0431)  Labs: Recent Labs    06/27/20 1838 06/27/20 1838 06/27/20 2052 06/27/20 2358 06/28/20 0722  HGB 9.3*   < >  --  9.2* 9.6*  HCT 27.2*  --   --  26.0* 27.1*  PLT 278  --   --  271 280  APTT  --   --  31  --   --   LABPROT  --   --  15.0  --  15.6*  INR  --   --  1.2  --  1.3*  HEPARINUNFRC  --   --   --   --  0.78*  CREATININE 0.87  --   --   --  0.90  TROPONINIHS 2,529*   < > 2,858* 5,685* 15,828*   < > = values in this interval not displayed.    Estimated Creatinine Clearance: 57.1 mL/min (by C-G formula based on SCr of 0.9 mg/dL).   Medications:  Confirmed with patient no PTA anticoagulants   Assessment: Pharmacy consulted for heparin dosing in a 18 YOF with presented with chest pain. Baseline troponin 2529 and CBC appropriate to start heparin with a bolus. Will watch Hgb while patient is on heparin.  Heparin DW:  71.7 kg  Give 4000 units bolus x 1 Start heparin infusion at 850 units/hr   Goal of Therapy:  Heparin level 0.3-0.7 units/ml Monitor platelets by anticoagulation protocol: Yes   Plan:  11/13  HL @0722  = 0.78 slightly supratherapetuic.  Will decrease Heparin to 750 units/hr. Check anti-Xa level in 6 hours and daily while on heparin, per protocol Continue to monitor H&H and platelets daily while on heparin   Ola Raap A 06/28/2020,8:53 AM

## 2020-06-28 NOTE — Consult Note (Signed)
[place] for Heparin  Indication: ACS / STEMI  Allergies  Allergen Reactions  . Food Color Red [Red Dye] Swelling    Patient Measurements: Height: 5\' 4"  (162.6 cm) Weight: 77.9 kg (171 lb 11.8 oz) IBW/kg (Calculated) : 54.7 Heparin Dosing Weight: 71.7 kg   Vital Signs: Temp: 97.8 F (36.6 C) (11/13 0431) Temp Source: Oral (11/13 0431) BP: 116/79 (11/13 1204) Pulse Rate: 82 (11/13 1204)  Labs: Recent Labs    06/27/20 1838 06/27/20 1838 06/27/20 2052 06/27/20 2358 06/28/20 0722 06/28/20 1523  HGB 9.3*   < >  --  9.2* 9.6*  --   HCT 27.2*  --   --  26.0* 27.1*  --   PLT 278  --   --  271 280  --   APTT  --   --  31  --   --   --   LABPROT  --   --  15.0  --  15.6*  --   INR  --   --  1.2  --  1.3*  --   HEPARINUNFRC  --   --   --   --  0.78* 0.60  CREATININE 0.87  --   --   --  0.90  --   TROPONINIHS 2,529*   < > 2,858* 5,685* 15,828*  --    < > = values in this interval not displayed.    Estimated Creatinine Clearance: 57.1 mL/min (by C-G formula based on SCr of 0.9 mg/dL).   Medications:  Confirmed with patient no PTA anticoagulants   Assessment: Pharmacy consulted for heparin dosing in a 62 YOF with presented with chest pain. Baseline troponin 2529 and CBC appropriate to start heparin with a bolus. Will watch Hgb while patient is on heparin.  Heparin DW:  71.7 kg  Give 4000 units bolus x 1 Start heparin infusion at 850 units/hr   Goal of Therapy:  Heparin level 0.3-0.7 units/ml Monitor platelets by anticoagulation protocol: Yes   Plan:  11/13  HL @1523  = 0.60 therapeutic s/p rate reduction to 750 units/hr. Will CONTINUE heparin rate of 750 units/hr. Check anti-Xa level in 8 hours and daily while on heparin, per protocol Continue to monitor H&H and platelets daily while on heparin   Kang Ishida R Niketa Turner 06/28/2020,4:13 PM

## 2020-06-28 NOTE — Progress Notes (Signed)
Mobility Specialist - Progress Note   06/28/20 1400  Mobility  Activity Ambulated in hall  Level of Assistance Independent after set-up  Assistive Device None (Pt pushed IV pole)  Distance Ambulated (ft) 180 ft  Mobility Response Tolerated well  Mobility performed by Mobility specialist  $Mobility charge 1 Mobility    Pre-mobility: 113 HR Post-mobility: 89 HR   Pt was sitting in recliner upon arrival utilizing room air. Pt agreed to session. Pt denied any pain, nausea, or fatigue. Unable to determine O2 sats this session d/t poor pleth. Pt was independent in all transfers this date, including ambulation. Pt denied dizziness upon standing. Pt ambulated 180' in room/hallway with no AD. No LOB noted. Pt denied SOB, no heavy breathing noted as pt is able to carry out conversation during activity. Pt denied weakness in LE. Per discussion with nursing staff, pt has been seen ambulating several times this date prior to session. Pt is eager to ambulate again this evening, states she's usually on her feet at home. Overall, pt tolerated session well. Pt was left in recliner with all needs in reach.    Kathee Delton Mobility Specialist 06/28/20, 2:36 PM

## 2020-06-28 NOTE — Progress Notes (Signed)
Patient ID: Stacy Stacy, female   DOB: 10/31/1945, 74 y.o.   MRN: [id number] [facility] PROGRESS NOTE  Lak[place] Alexyia Guarino IEP:[id number] DOB: 1946-05-27 DOA: 06/27/2020 PCP: Lonn Georgia, FNP  HPI/Subjective: Patient came in with chest pain last night lasting from about 7:55 PM.  She had some sweating.  Some nausea.  Not much shortness of breath.  Today complains of back pain in her right upper back.  States that the bed was not good.  Asking for new bed.  Objective: Vitals:   06/28/20 0920 06/28/20 1204  BP: 135/80 116/79  Pulse: 76 82  Resp: 20 18  Temp:    SpO2:      Intake/Output Summary (Last 24 hours) at 06/28/2020 1440 Last data filed at 06/28/2020 1026 Gross per 24 hour  Intake 603 ml  Output 1000 ml  Net -397 ml   Filed Weights   06/27/20 1831 06/27/20 2300 06/28/20 0431  Weight: 79.4 kg 78.2 kg 77.9 kg    ROS: Review of Systems  Respiratory: Negative for cough and shortness of breath.   Cardiovascular: Negative for chest pain.  Gastrointestinal: Negative for abdominal pain, nausea and vomiting.  Musculoskeletal: Positive for back pain.   Exam: Physical Exam HENT:     Head: Normocephalic.     Mouth/Throat:     Pharynx: No oropharyngeal exudate.  Eyes:     General: Lids are normal.     Conjunctiva/sclera: Conjunctivae normal.     Pupils: Pupils are equal, round, and reactive to light.  Cardiovascular:     Rate and Rhythm: Normal rate and regular rhythm.     Heart sounds: Normal heart sounds, S1 normal and S2 normal.  Pulmonary:     Breath sounds: Examination of the right-lower field reveals decreased breath sounds. Examination of the left-lower field reveals decreased breath sounds. Decreased breath sounds present. No wheezing, rhonchi or rales.  Abdominal:     Palpations: Abdomen is soft.     Tenderness: There is no abdominal tenderness.  Musculoskeletal:     Right lower leg: No swelling.     Left lower leg: No swelling.   Skin:    General: Skin is warm.     Findings: No rash.  Neurological:     Mental Status: She is alert and oriented to person, place, and time.       Data Reviewed: Basic Metabolic Panel: Recent Labs  Lab 06/27/20 1838 06/27/20 2358 06/28/20 0722  NA 134*  --  136  K 3.4*  --  3.3*  CL 98  --  101  CO2 23  --  25  GLUCOSE 157*  --  133*  BUN 21  --  19  CREATININE 0.87  --  0.90  CALCIUM 8.4*  --  8.3*  MG  --  1.6*  --    Liver Function Tests: Recent Labs  Lab 06/27/20 2358  AST 97*  ALT 42  ALKPHOS 78  BILITOT 0.9  PROT 6.5  ALBUMIN 3.2*   CBC: Recent Labs  Lab 06/27/20 1838 06/27/20 2358 06/28/20 0722  WBC 3.6* 4.3 4.3  NEUTROABS  --  2.5  --   HGB 9.3* 9.2* 9.6*  HCT 27.2* 26.0* 27.1*  MCV 83.4 80.5 81.6  PLT 278 271 280   BNP (last 3 results) Recent Labs    06/27/20 2358  BNP 1,194.3*    CBG: Recent Labs  Lab 06/27/20 2313 06/28/20 0427 06/28/20 0906 06/28/20 1232  GLUCAP 136* 127* 151* 113*  Recent Results (from the past 240 hour(s))  Respiratory Panel by RT PCR (Flu A&B, Covid) - Nasopharyngeal Swab     Status: None   Collection Time: 06/27/20  8:52 PM   Specimen: Nasopharyngeal Swab  Result Value Ref Range Status   SARS Coronavirus 2 by RT PCR NEGATIVE NEGATIVE Final    Comment: (NOTE) SARS-CoV-2 target nucleic acids are NOT DETECTED.  The SARS-CoV-2 RNA is generally detectable in upper respiratoy specimens during the acute phase of infection. The lowest concentration of SARS-CoV-2 viral copies this assay can detect is 131 copies/mL. A negative result does not preclude SARS-Cov-2 infection and should not be used as the sole basis for treatment or other patient management decisions. A negative result may occur with  improper specimen collection/handling, submission of specimen other than nasopharyngeal swab, presence of viral mutation(s) within the areas targeted by this assay, and inadequate number of viral copies (<131  copies/mL). A negative result must be combined with clinical observations, patient history, and epidemiological information. The expected result is Negative.  Fact Sheet for Patients:  PinkCheek.be  Fact Sheet for Healthcare Providers:  GravelBags.it  This test is no t yet approved or cleared by the [place] FDA and  has been authorized for detection and/or diagnosis of SARS-CoV-2 by FDA under an Emergency Use Authorization (EUA). This EUA will remain  in effect (meaning this test can be used) for the duration of the COVID-19 declaration under Section 564(b)(1) of the Act, 21 U.S.C. section 360bbb-3(b)(1), unless the authorization is terminated or revoked sooner.     Influenza A by PCR NEGATIVE NEGATIVE Final   Influenza B by PCR NEGATIVE NEGATIVE Final    Comment: (NOTE) The Xpert Xpress SARS-CoV-2/FLU/RSV assay is intended as an aid in  the diagnosis of influenza from Nasopharyngeal swab specimens and  should not be used as a sole basis for treatment. Nasal washings and  aspirates are unacceptable for Xpert Xpress SARS-CoV-2/FLU/RSV  testing.  Fact Sheet for Patients: PinkCheek.be  Fact Sheet for Healthcare Providers: GravelBags.it  This test is not yet approved or cleared by the [place] FDA and  has been authorized for detection and/or diagnosis of SARS-CoV-2 by  FDA under an Emergency Use Authorization (EUA). This EUA will remain  in effect (meaning this test can be used) for the duration of the  Covid-19 declaration under Section 564(b)(1) of the Act, 21  U.S.C. section 360bbb-3(b)(1), unless the authorization is  terminated or revoked. Performed at [facility], [address] [place], [place] [address]      Studies: DG Chest 2 View  Result Date: 06/27/2020 CLINICAL DATA:  74 year old female with chest pain. EXAM: CHEST - 2  VIEW COMPARISON:  None. FINDINGS: There is diffuse chronic interstitial coarsening. A small right pleural effusion is suspected. No focal consolidation or pneumothorax. Mild cardiomegaly. Atherosclerotic calcification of the aorta. No acute osseous pathology. IMPRESSION: Probable small right pleural effusion.  No focal consolidation. Electronically Signed   By: Anner Crete M.D.   On: 06/27/2020 19:21    Scheduled Meds: . aspirin EC  81 mg Oral Daily  . carvedilol  6.25 mg Oral BID  . influenza vaccine adjuvanted  0.5 mL Intramuscular Tomorrow-1000  . levothyroxine  50 mcg Oral QAC breakfast  . loratadine  10 mg Oral Daily  . losartan  25 mg Oral Daily  . pneumococcal 23 valent vaccine  0.5 mL Intramuscular Tomorrow-1000  . simvastatin  40 mg Oral q1800  . sodium chloride flush  3 mL Intravenous Q12H   Continuous Infusions: . sodium chloride    . heparin 750 Units/hr (06/28/20 0947)    Assessment/Plan:  1. Acute myocardial infarction with elevating troponins up to 15,828 this morning.  Patient on heparin drip.  Continue aspirin, Coreg and simvastatin.  Case discussed with cardiology and cardiac cath planned for Monday. 2. Acute on Chronic diastolic congestive heart failure.  Continue Coreg.  Lisinopril switched over to losartan.  Given 1 dose of IV Lasix.  Reassess tomorrow. 3. Essential hypertension on losartan and Coreg. 4. Hypokalemia potassium replaced today recheck tomorrow. 5. Hypothyroidism unspecified on levothyroxine 6. Type 2 diabetes mellitus with hyperlipidemia unspecified on simvastatin.  Since hemoglobin A1c on the lower side at 6.2 will get rid of sliding scale insulin.     Code Status:     Code Status Orders  (From admission, onward)         Start     Ordered   06/27/20 2259  Full code  Continuous        06/27/20 2258        Code Status History    Date Active Date Inactive Code Status Order ID Comments User Context   02/28/2018 1018 02/28/2018 1540 Full  Code [id number]  End, Harrell Gave, MD Inpatient   Advance Care Planning Activity     Family Communication: Spoke with Beverlee Nims on the phone Disposition Plan: Status is: Inpatient   Dispo: The patient is from: Home              Anticipated d/c is to: Home              Anticipated d/c date is: Potential Monday evening if cardiac cath negative or Tuesday if a stent placed.              Patient currently being treated for NSTEMI with IV heparin, aspirin and Coreg.  Consultants:  Cardiology  Time spent: 27 minutes  [place]

## 2020-06-28 NOTE — Consult Note (Addendum)
Cardiology Consultation:   Patient ID: Stacy Newman MRN: [id number]; DOB: [birth year]  Admit date: 06/27/2020 Date of Consult: 06/28/2020  Primary Care Provider: Lonn Georgia, [place] Primary Cardiologist: Nelva Bush, MD Primary Electrophysiologist:  None    Patient Profile:   Stacy Newman is a 74 y.o. female with a hx of HFrEF secondary to nonischemic cardiomyopathy, DM2, hypertension, hyperlipidemia, hypothyroidism, and remote left breast cancer s/p lumpectomy and chemoradiation, and who is being seen today for the evaluation of NSTEMI at the request of Dr. Leslye Peer.  History of Present Illness:   Stacy Newman is a 73 year old female with PMH as above.    She has history of CAD s/p [facility] 02/28/2017 that showed nonobstructive CAD with LMCA normal, mLAD 20%s, large D1 with proximal 40% stenosis, pLCx 20 to 30%s, nondominant RCA without significant disease.  She is followed by [facility] with last office visit 07/20/2019 by her primary cardiologist, Dr. Harrell Gave End.  At that time, she reported feeling well.  She reported complete resolution of her earlier shortness of breath.  She had infrequent ankle edema, using as needed furosemide only 1 time per month.  No medication changes or further work-up was recommended.  12/2019 echo showed EF 40 to 45%, global hypokinesis, G1 DD, mild to moderate MR.  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  She drinks a cup of coffee every other day.  She denies any alcohol, tobacco, or drug use.  Yesterday, 06/27/2020, she called the office and reported weakness and that she felt as if she was hit by a mac truck.  She denied any chest pain or dizziness as at the time of the call. She reports that later yesterday and around 5 PM on 06/27/2020, she was in her recliner when she started to feel left-sided chest pain that was severe and associated with diaphoresis and nausea.  This chest pain persisted about an hour  before self resolving.  She is uncertain but believes that the chest pain felt better when leaning forward as compared to leaning backwards.  She had not recently eaten but had skipped lunch due to being busy that day.  She denied any associated shortness of breath.  The chest pain has not recurred since that time.  She denies feeling chest pain like this before in the past, including before her 2019 catheterization.  At the time of cardiology consultation, she denied chest pain but reported severe back pain, due to the hospital bed.  She presented to [facility].  In the ED, initial vitals significant for HR 91 bpm, BP 146/89, 99% on room air.  Labs showed hypokalemia with potassium 3.4, renal function stable, high-sensitivity troponin 2529  2858  5605  15828. BNP 1194.3.  Also noted was hypoalbuminemia at 3.2 and elevated AST at 97.  Hypomagnesia at 1.6.  B12 elevated at 3470.  Anemia with hemoglobin 9.2, RBC 3.23, and hematocrit 26.0. A1C 6.2. Initial EKG with notable changes in the inferior EKG with Dr. Clayborn Bigness of [facility] called and reporting that she did not meet STEMI criteria. She was started on IV heparin.   Repeat EKGs have not been uploaded to Epic.   Heart Pathway Score:     Past Medical History:  Diagnosis Date  . Cancer (HCC)    BREAST  . Diabetes mellitus without complication (HCC)    TYPE 11  . Hypercholesterolemia   . Hypertension   . Hypothyroidism     Past Surgical History:  Procedure Laterality Date  .  BREAST SURGERY  2001   LUMPECTOMY LEFT SIDE  . CARDIAC CATHETERIZATION    . CATARACT EXTRACTION W/PHACO Right 03/31/2015   Procedure: CATARACT EXTRACTION PHACO AND INTRAOCULAR LENS PLACEMENT (IOC);  Surgeon: Ronnell Freshwater, MD;  Location: [place];  Service: Ophthalmology;  Laterality: Right;  DIABETIC-ORAL MEDS  . CATARACT EXTRACTION W/PHACO Left 12/29/2015   Procedure: CATARACT EXTRACTION PHACO AND INTRAOCULAR LENS PLACEMENT (IOC);  Surgeon: Ronnell Freshwater, MD;  Location: [place];  Service: Ophthalmology;  Laterality: Left;  DIABETIC - oral meds LEAVE PT AT 830 ARRIVAL TIME  . RIGHT/LEFT HEART CATH AND CORONARY ANGIOGRAPHY Bilateral 02/28/2018   Procedure: RIGHT/LEFT HEART CATH AND CORONARY ANGIOGRAPHY;  Surgeon: Nelva Bush, MD;  Location: [place] CV [facility];  Service: Cardiovascular;  Laterality: Bilateral;     Home Medications:  Prior to Admission medications   Medication Sig Start Date End Date Taking? Authorizing Provider  carvedilol (COREG) 6.25 MG tablet Take 1 tablet (6.25 mg total) by mouth 2 (two) times daily. 02/22/18  Yes End, Harrell Gave, MD  furosemide (LASIX) 40 MG tablet Take 40 mg by mouth daily.   Yes [provider]  glipiZIDE (GLUCOTROL) 5 MG tablet Take 5 mg by mouth 2 (two) times daily before a meal.    Yes [provider]  levothyroxine (SYNTHROID, LEVOTHROID) 50 MCG tablet Take 50 mcg by mouth daily before breakfast.   Yes [provider]  lisinopril (ZESTRIL) 20 MG tablet Take 20 mg by mouth daily.   Yes [provider]  metFORMIN (GLUCOPHAGE) 500 MG tablet Take 500 mg by mouth at bedtime.    Yes [provider]  simvastatin (ZOCOR) 40 MG tablet Take 40 mg by mouth daily.    Yes [provider]    Inpatient Medications: Scheduled Meds: . aspirin EC  81 mg Oral Daily  . carvedilol  6.25 mg Oral BID  . influenza vaccine adjuvanted  0.5 mL Intramuscular Tomorrow-1000  . insulin aspart  0-9 Units Subcutaneous Q4H  . levothyroxine  50 mcg Oral QAC breakfast  . loratadine  10 mg Oral Daily  . losartan  25 mg Oral Daily  . pneumococcal 23 valent vaccine  0.5 mL Intramuscular Tomorrow-1000  . simvastatin  40 mg Oral q1800  . sodium chloride flush  3 mL Intravenous Q12H   Continuous Infusions: . sodium chloride    . heparin 750 Units/hr (06/28/20 0947)   PRN Meds: sodium chloride, acetaminophen, ALPRAZolam, nitroGLYCERIN,  ondansetron (ZOFRAN) IV, sodium chloride flush  Allergies:    Allergies  Allergen Reactions  . Food Color Red [Red Dye] Swelling    Social History:   Social History   Socioeconomic History  . Marital status: Single    Spouse name: Not on file  . Number of children: Not on file  . Years of education: Not on file  . Highest education level: Not on file  Occupational History  . Not on file  Tobacco Use  . Smoking status: Never Smoker  . Smokeless tobacco: Never Used  Vaping Use  . Vaping Use: Never used  Substance and Sexual Activity  . Alcohol use: No  . Drug use: Never  . Sexual activity: Not on file  Other Topics Concern  . Not on file  Social History Narrative  . Not on file   Social Determinants of Health   Financial Resource Strain:   . Difficulty of Paying Living Expenses: Not on file  Food Insecurity:   . Worried About  Running Out of Food in the Last Year: Not on file  . Ran Out of Food in the Last Year: Not on file  Transportation Needs:   . Lack of Transportation (Medical): Not on file  . Lack of Transportation (Non-Medical): Not on file  Physical Activity:   . Days of Exercise per Week: Not on file  . Minutes of Exercise per Session: Not on file  Stress:   . Feeling of Stress : Not on file  Social Connections:   . Frequency of Communication with Friends and Family: Not on file  . Frequency of Social Gatherings with Friends and Family: Not on file  . Attends Religious Services: Not on file  . Active Member of Clubs or Organizations: Not on file  . Attends Archivist Meetings: Not on file  . Marital Status: Not on file  Intimate Partner Violence:   . Fear of Current or Ex-Partner: Not on file  . Emotionally Abused: Not on file  . Physically Abused: Not on file  . Sexually Abused: Not on file    Family History:    Family History  Problem Relation Age of Onset  . Alzheimer's disease Mother   . Lung cancer Father      ROS:  Please see  the history of present illness.  Review of Systems  Cardiovascular: Positive for chest pain. Negative for palpitations, orthopnea and leg swelling.  Gastrointestinal: Positive for nausea.  Neurological: Positive for dizziness and weakness. Negative for loss of consciousness and headaches.    All other ROS reviewed and negative.     Physical Exam/Data:   Vitals:   06/27/20 2252 06/27/20 2300 06/28/20 0431 06/28/20 0920  BP: 137/84  127/80 135/80  Pulse: 95  77 76  Resp: _0 Temp: 98.1 F (36.7 C)  97.8 F (36.6 C)   TempSrc: Oral  Oral   SpO2: 100%  100%   Weight:  78.2 kg 77.9 kg   Height:  _1  (1.626 m)      Intake/Output Summary (Last 24 hours) at 06/28/2020 1021 Last data filed at 06/28/2020 0431 Gross per 24 hour  Intake 123 ml  Output 1000 ml  Net -877 ml   Last 3 Weights 06/28/2020 06/27/2020 06/27/2020  Weight (lbs) 171 lb 11.8 oz 172 lb 6.4 oz 175 lb  Weight (kg) 77.9 kg 78.2 kg 79.379 kg     Body mass index is 29.48 kg/m.  General: Seated in recliner next to bed, appears uncomfortable (reports back pain) HEENT: normal Neck: no JVD Vascular: No carotid bruits; radial pulses 2+ bilaterally Cardiac:  normal S1, S2; RRR; 1/6 systolic murmur Lungs:  clear to auscultation bilaterally, no wheezing, rhonchi or rales  Abd: soft, nontender, no hepatomegaly  Ext: Moderate bilateral nonpitting edema Musculoskeletal:  No deformities, BUE and BLE strength normal and equal Skin: warm and dry  Neuro:  No focal abnormalities noted Psych:  Normal affect   EKG:  The EKG was personally reviewed and demonstrates:  NSR, 91 bpm, ST changes and TWI noted in the inferior leads, PVCs, previous anterior infarct Telemetry:  Telemetry was personally reviewed and demonstrates:  NSR-ST, PVCs, NSVT with 4 beat NSVT  Relevant CV Studies: Echo 12/25/19 1. Left ventricular ejection fraction, by estimation, is 40 to 45%. The  left ventricle has mild to moderately decreased  function. The left  ventricle demonstrates global hypokinesis. Left ventricular diastolic  parameters are consistent with Grade I  diastolic dysfunction (impaired relaxation).  2. Right ventricular systolic function is normal. The right ventricular  size is normal.  3. The mitral valve is degenerative. Mild to moderate mitral valve  regurgitation. No evidence of mitral stenosis.  4. The aortic valve is tricuspid. Aortic valve regurgitation is not  visualized. Mild aortic valve sclerosis is present, with no evidence of  aortic valve stenosis.  5. The inferior vena cava is normal in size with greater than 50%  respiratory variability, suggesting right atrial pressure of 3 mmHg.   R/LHC 02/2018 Conclusions: 1. Mild to moderate, non-obstructive coronary artery disease with 20-30% mid LAD and proximal LCx stenoses, as well as 40% proximal D1 lesion.  Findings are consistent with non-ischemic cardiomyopathy. 2. Mildly to moderately elevated left heart filling pressures. 3. Mildly elevated right heart filling pressures. 4. Moderate pulmonary hypertension. 5. Reduced Fick cardiac output/index. Recommendations: 1. Medical therapy and risk factor modification to prevent progression of non-obstructive CAD. 2. Discontinue HCTZ and start furosemide 40 mg daily. 3. BMP in the office in ~1 week; clinic follow-up in ~2 weeks Recommend Aspirin 76m daily for moderate CAD.    Laboratory Data:  High Sensitivity Troponin:   Recent Labs  Lab 06/27/20 1838 06/27/20 2052 06/27/20 2358 06/28/20 0722  TROPONINIHS 2,529* 2,858* 5,685* 15,828*     Cardiac EnzymesNo results for input(s): TROPONINI in the last 168 hours. No results for input(s): TROPIPOC in the last 168 hours.  Chemistry Recent Labs  Lab 06/27/20 1838 06/28/20 0722  NA 134* 136  K 3.4* 3.3*  CL 98 101  CO2 23 25  GLUCOSE 157* 133*  BUN 21 19  CREATININE 0.87 0.90  CALCIUM 8.4* 8.3*  GFRNONAA >60 >60  ANIONGAP 13 10     Recent Labs  Lab 06/27/20 2358  PROT 6.5  ALBUMIN 3.2*  AST 97*  ALT 42  ALKPHOS 78  BILITOT 0.9   Hematology Recent Labs  Lab 06/27/20 1838 06/27/20 2358 06/28/20 0722  WBC 3.6* 4.3 4.3  RBC 3.26* 3.23*  3.15* 3.32*  HGB 9.3* 9.2* 9.6*  HCT 27.2* 26.0* 27.1*  MCV 83.4 80.5 81.6  MCH 28.5 28.5 28.9  MCHC 34.2 35.4 35.4  RDW 14.6 14.1 14.4  PLT 278 271 280   BNP Recent Labs  Lab 06/27/20 2358  BNP 1,194.3*    DDimer No results for input(s): DDIMER in the last 168 hours.   Radiology/Studies:  DG Chest 2 View  Result Date: 06/27/2020 CLINICAL DATA:  74year old female with chest pain. EXAM: CHEST - 2 VIEW COMPARISON:  None. FINDINGS: There is diffuse chronic interstitial coarsening. A small right pleural effusion is suspected. No focal consolidation or pneumothorax. Mild cardiomegaly. Atherosclerotic calcification of the aorta. No acute osseous pathology. IMPRESSION: Probable small right pleural effusion.  No focal consolidation. Electronically Signed   By: AAnner CreteM.D.   On: 06/27/2020 19:21     Assessment and Plan:   NSTEMI --No current CP. Reports an episode of chest pain 11/12 that lasted for approximately 1 hour with findings/story concerning for cardiac ischemia. Previous echo as above with EF 40-45%. Previous 2019 cath with mild nonobstructive CAD as above in findings consistent with nonischemic cardiomyopathy.  Risk factors for cardiac etiology include known CAD, age, and comorbid DM2. --EKG with inferior changes and TWI in inferior leads as above and pending repeat EKG. Previous EKG with Dr. ESaunders Reveldid show TWI in inferior leads. --HS Tn 2,529   15,828.  --Continue to cycle until peaked, down-trending. --Further ischemic workup recommended. --Echo pending  to assess EF, wall motion, valvular function, and rule out acute structural changes. --Plan for catheterization 06/30/20 with Dr. Fletcher Anon. Risks and benefits of cardiac catheterization have been  discussed with the patient.  These include bleeding, infection, kidney damage, stroke, heart attack, death.  The patient understands these risks and is willing to proceed. --NPO after midnight Sunday-Monday 11/14-11/15. --KVO fluids given reduced EF. --Continue IV heparin. --Daily CBC, BMET. --Continue medical management with ASA, BB, statin, and PRN SL nitro. Escalation of GDMT as below and with echo pending. Further recommendations pending cath and echo.  Hypokalemia, hypomagnesium --Potassium 3.3, magnesium 1.6 --Recommend repletion with goal K 4.0, Mg goal 2.0. --Consider addition of spironolactone given room in BP, reduced EF, and low K+. --Daily BMET.  NSVT --Replete electrolytes. --Continue Coreg 6.61m BID.  --Escalation of Coreg as tolerated.  HFrEF, NICM --No SOB. --NYHA class I sx. --BNP 1194.3. --Pending echo to reassess EF. --Continue Coreg, lisinopril, and lasix.  --Escalation of GDMT with spironolactone and Entresto, depending on repeat echo EF. Will need 48-hour washout of lisinopril before starting Entresto. Discontinued Lisinopril for now. Started losartan for additional BP support during lisinopril washout.  HTN  --BP suboptimal at presentation. --Goal BP 130/80 or lower. --Escalate antihypertensives as tolerated. --As above, depending on EF, recommend [facility]isinop[facility] lisinopril. Discontinued lisinopril for now.  Started on Losartan for additional BP support during washout.   HLD --LDL 106, total cholesterol 162.   --Not at goal of LDL below 70. --Recommend escalation of statin with high intensity atorvastatin 866m+/- Zetia with repeat lipid and liver function in 6 to 8 weeks as an outpatient.  Anemia --On review of EMR, Hgb stable when compared with previous labs.  DM2 --SSI, glycemic control recommended. --Continue losartan for comorbid HTN.      TIMI Risk Score for Unstable Angina or Non-ST Elevation  MI:   The patient's TIMI risk score is 5, which indicates a 26% risk of all cause mortality, new or recurrent myocardial infarction or need for urgent revascularization in the next 14 days.  New York Heart Association (NYHA) Functional Class NYHA Class I        For questions or updates, please contact [email or web address] Please consult [email or web address] for contact info under    Signed, JaArvil ChacoPA-C  06/28/2020 10:21 AM

## 2020-06-29 ENCOUNTER — Inpatient Hospital Stay (HOSPITAL_COMMUNITY)
Admit: 2020-06-29 | Discharge: 2020-06-29 | Disposition: A | Discharge status: Home / Self Care | Payer: BC Managed Care – PPO | Attending: Internal Medicine | Admitting: Internal Medicine

## 2020-06-29 DIAGNOSIS — E1169 Type 2 diabetes mellitus with other specified complication: Secondary | ICD-10-CM | POA: Diagnosis not present

## 2020-06-29 DIAGNOSIS — Z566 Other physical and mental strain related to work: Secondary | ICD-10-CM

## 2020-06-29 DIAGNOSIS — I214 Non-ST elevation (NSTEMI) myocardial infarction: Secondary | ICD-10-CM

## 2020-06-29 DIAGNOSIS — E876 Hypokalemia: Secondary | ICD-10-CM

## 2020-06-29 DIAGNOSIS — I10 Essential (primary) hypertension: Secondary | ICD-10-CM

## 2020-06-29 DIAGNOSIS — I428 Other cardiomyopathies: Secondary | ICD-10-CM | POA: Diagnosis not present

## 2020-06-29 LAB — CBC
HCT: 27.6 % — ABNORMAL LOW (ref 36.0–46.0)
Hemoglobin: 9.7 g/dL — ABNORMAL LOW (ref 12.0–15.0)
MCH: 28.4 pg (ref 26.0–34.0)
MCHC: 35.1 g/dL (ref 30.0–36.0)
MCV: 80.9 fL (ref 80.0–100.0)
Platelets: 290 10*3/uL (ref 150–400)
RBC: 3.41 MIL/uL — ABNORMAL LOW (ref 3.87–5.11)
RDW: 14.6 % (ref 11.5–15.5)
WBC: 5.4 10*3/uL (ref 4.0–10.5)
nRBC: 0 % (ref 0.0–0.2)

## 2020-06-29 LAB — GLUCOSE, CAPILLARY
Glucose-Capillary: 123 mg/dL — ABNORMAL HIGH (ref 70–99)
Glucose-Capillary: 133 mg/dL — ABNORMAL HIGH (ref 70–99)
Glucose-Capillary: 127 mg/dL — ABNORMAL HIGH (ref 70–99)
Glucose-Capillary: 173 mg/dL — ABNORMAL HIGH (ref 70–99)

## 2020-06-29 LAB — ECHOCARDIOGRAM COMPLETE
AR max vel: 1.11 cm2
AV Peak grad: 9.7 mmHg
Ao pk vel: 1.56 m/s
Area-P 1/2: 6.83 cm2
Calc EF: 36.4 %
Height: 64 in
S' Lateral: 3.94 cm
Single Plane A2C EF: 34.1 %
Single Plane A4C EF: 39.1 %
Weight: 2742.4 oz

## 2020-06-29 LAB — HEPARIN LEVEL (UNFRACTIONATED): Heparin Unfractionated: 0.43 IU/mL (ref 0.30–0.70)

## 2020-06-29 NOTE — Progress Notes (Signed)
*  PRELIMINARY RESULTS* Echocardiogram 2D Echocardiogram has been performed. 3D and Strain were performed on this study.  Stacy Newman 06/29/2020, 2:04 PM

## 2020-06-29 NOTE — Progress Notes (Signed)
Mobility Specialist - Progress Note   06/29/20 1000  Mobility  Activity Ambulated in hall  Level of Assistance Standby assist, set-up cues, supervision of patient - no hands on  Assistive Device None (Pt pushed IV pole)  Distance Ambulated (ft) 180 ft  Mobility Response Tolerated well  Mobility performed by Mobility specialist  $Mobility charge 1 Mobility    Pre-mobility: 94 HR, 100% SpO2 Post-mobility: 103 HR, 99% SpO2   Pt was sitting in recliner upon arrival. Pt agreed to session. Pt denied fatigue and nausea, but did c/o lower back pain rating it a "3/10". Pt ambulated 180' in room/hallway, pushing own IV pole. No LOB noted. Pt denied weakness, SOB, and dizziness. Pt took a standing rest break every ~40' to tolerate back pain, noted to be on L side of lower back. Overall, pt tolerated session well. Pt was left in recliner with all needs in reach. Nurse notified.    Kathee Delton Mobility Specialist 06/29/20, 10:56 AM

## 2020-06-29 NOTE — Progress Notes (Signed)
Patient ID: Stacy Stacy, female   DOB: [birth year], 74 y.o.   MRN: [id number] [facility] PROGRESS NOTE  Stacy Stacy VPX:[id number] DOB: [birth year] DOA: 06/27/2020 PCP: Lonn Georgia, FNP  HPI/Subjective: Patient feeling okay.  Slept in the chair.  No further back pain.  No chest pain shortness of breath nausea vomiting or sweating.  Admitted with NSTEMI.  Objective: Vitals:   06/29/20 0832 06/29/20 1100  BP: 131/71 122/77  Pulse: 94 90  Resp: 18 18  Temp: 99.3 F (37.4 C) 99 F (37.2 C)  SpO2: 99% 99%    Intake/Output Summary (Last 24 hours) at 06/29/2020 1316 Last data filed at 06/29/2020 1114 Gross per 24 hour  Intake 491.35 ml  Output 1950 ml  Net -1458.65 ml   Filed Weights   06/27/20 2300 06/28/20 0431 06/29/20 0410  Weight: 78.2 kg 77.9 kg 77.7 kg    ROS: Review of Systems  Respiratory: Negative for cough and shortness of breath.   Cardiovascular: Negative for chest pain.  Gastrointestinal: Negative for abdominal pain, nausea and vomiting.   Exam: Physical Exam HENT:     Head: Normocephalic.     Mouth/Throat:     Pharynx: No oropharyngeal exudate.  Eyes:     General: Lids are normal.     Conjunctiva/sclera: Conjunctivae normal.     Pupils: Pupils are equal, round, and reactive to light.  Cardiovascular:     Rate and Rhythm: Normal rate and regular rhythm.     Heart sounds: Normal heart sounds, S1 normal and S2 normal.  Pulmonary:     Breath sounds: No decreased breath sounds, wheezing, rhonchi or rales.  Abdominal:     Palpations: Abdomen is soft.     Tenderness: There is no abdominal tenderness.  Musculoskeletal:     Right ankle: No swelling.     Left ankle: No swelling.  Skin:    General: Skin is warm.     Findings: No rash.  Neurological:     Mental Status: She is alert and oriented to person, place, and time.       Data Reviewed: Basic Metabolic Panel: Recent Labs  Lab 06/27/20 1838 06/27/20 2358  06/28/20 0722  NA 134*  --  136  K 3.4*  --  3.3*  CL 98  --  101  CO2 23  --  25  GLUCOSE 157*  --  133*  BUN 21  --  19  CREATININE 0.87  --  0.90  CALCIUM 8.4*  --  8.3*  MG  --  1.6*  --    Liver Function Tests: Recent Labs  Lab 06/27/20 2358  AST 97*  ALT 42  ALKPHOS 78  BILITOT 0.9  PROT 6.5  ALBUMIN 3.2*   CBC: Recent Labs  Lab 06/27/20 1838 06/27/20 2358 06/28/20 0722 06/29/20 0433  WBC 3.6* 4.3 4.3 5.4  NEUTROABS  --  2.5  --   --   HGB 9.3* 9.2* 9.6* 9.7*  HCT 27.2* 26.0* 27.1* 27.6*  MCV 83.4 80.5 81.6 80.9  PLT 278 271 280 290   BNP (last 3 results) Recent Labs    06/27/20 2358  BNP 1,194.3*     CBG: Recent Labs  Lab 06/28/20 1232 06/28/20 1711 06/28/20 2042 06/29/20 0426 06/29/20 0828  GLUCAP 113* 99 149* 123* 133*    Recent Results (from the past 240 hour(s))  Respiratory Panel by RT PCR (Flu A&B, Covid) - Nasopharyngeal Swab     Status: None   Collection  Time: 06/27/20  8:52 PM   Specimen: Nasopharyngeal Swab  Result Value Ref Range Status   SARS Coronavirus 2 by RT PCR NEGATIVE NEGATIVE Final    Comment: (NOTE) SARS-CoV-2 target nucleic acids are NOT DETECTED.  The SARS-CoV-2 RNA is generally detectable in upper respiratoy specimens during the acute phase of infection. The lowest concentration of SARS-CoV-2 viral copies this assay can detect is 131 copies/mL. A negative result does not preclude SARS-Cov-2 infection and should not be used as the sole basis for treatment or other patient management decisions. A negative result may occur with  improper specimen collection/handling, submission of specimen other than nasopharyngeal swab, presence of viral mutation(s) within the areas targeted by this assay, and inadequate number of viral copies (<131 copies/mL). A negative result must be combined with clinical observations, patient history, and epidemiological information. The expected result is Negative.  Fact Sheet for  Patients:  PinkCheek.be  Fact Sheet for Healthcare Providers:  GravelBags.it  This test is no t yet approved or cleared by the [place] FDA and  has been authorized for detection and/or diagnosis of SARS-CoV-2 by FDA under an Emergency Use Authorization (EUA). This EUA will remain  in effect (meaning this test can be used) for the duration of the COVID-19 declaration under Section 564(b)(1) of the Act, 21 U.S.C. section 360bbb-3(b)(1), unless the authorization is terminated or revoked sooner.     Influenza A by PCR NEGATIVE NEGATIVE Final   Influenza B by PCR NEGATIVE NEGATIVE Final    Comment: (NOTE) The Xpert Xpress SARS-CoV-2/FLU/RSV assay is intended as an aid in  the diagnosis of influenza from Nasopharyngeal swab specimens and  should not be used as a sole basis for treatment. Nasal washings and  aspirates are unacceptable for Xpert Xpress SARS-CoV-2/FLU/RSV  testing.  Fact Sheet for Patients: PinkCheek.be  Fact Sheet for Healthcare Providers: GravelBags.it  This test is not yet approved or cleared by the [place] FDA and  has been authorized for detection and/or diagnosis of SARS-CoV-2 by  FDA under an Emergency Use Authorization (EUA). This EUA will remain  in effect (meaning this test can be used) for the duration of the  Covid-19 declaration under Section 564(b)(1) of the Act, 21  U.S.C. section 360bbb-3(b)(1), unless the authorization is  terminated or revoked. Performed at [facility], [address] [place], [place] [address]      Studies: DG Chest 2 View  Result Date: 06/27/2020 CLINICAL DATA:  74 year old female with chest pain. EXAM: CHEST - 2 VIEW COMPARISON:  None. FINDINGS: There is diffuse chronic interstitial coarsening. A small right pleural effusion is suspected. No focal consolidation or pneumothorax. Mild  cardiomegaly. Atherosclerotic calcification of the aorta. No acute osseous pathology. IMPRESSION: Probable small right pleural effusion.  No focal consolidation. Electronically Signed   By: Anner Crete M.D.   On: 06/27/2020 19:21    Scheduled Meds: . aspirin EC  81 mg Oral Daily  . carvedilol  6.25 mg Oral BID  . influenza vaccine adjuvanted  0.5 mL Intramuscular Tomorrow-1000  . levothyroxine  50 mcg Oral QAC breakfast  . loratadine  10 mg Oral Daily  . losartan  25 mg Oral Daily  . pneumococcal 23 valent vaccine  0.5 mL Intramuscular Tomorrow-1000  . simvastatin  40 mg Oral q1800  . sodium chloride flush  3 mL Intravenous Q12H   Continuous Infusions: . sodium chloride    . heparin 750 Units/hr (06/29/20 0300)    Assessment/Plan:  1.  NSTEMI  with acute myocardial infarction with elevated troponins up to 15,828.  Continue heparin drip.  On aspirin, Coreg and simvastatin.  Cardiac catheterization planned for Monday. 2.  Acute on chronic systolic heart failure given 1 dose of IV Lasix yesterday.  Continue Coreg and losartan. 3.  Hypokalemia.  Check electrolytes tomorrow morning. 4.  Iron deficiency anemia continue to monitor.  Hemoglobin actually trending up a little bit. 5.  Hyperlipidemia unspecified on simvastatin 6.  Hypothyroidism unspecified on levothyroxine 7.  Impaired fasting glucose.  Since hemoglobin A1c is less than 6.5 at 6.2,  I will classify her as impaired fasting glucose.        Code Status:     Code Status Orders  (From admission, onward)         Start     Ordered   06/27/20 2259  Full code  Continuous        06/27/20 2258        Code Status History    Date Active Date Inactive Code Status Order ID Comments User Context   02/28/2018 1018 02/28/2018 1540 Full Code [id number]  Nelva Bush, MD Inpatient   Advance Care Planning Activity     Family Communication: Spoke with family yesterday about the plan for cardiac catheterization on  Monday Disposition Plan: Status is: Inpatient  Dispo: The patient is from: Home              Anticipated d/c is to: Home              Anticipated d/c date is: Potential Monday afternoon versus Tuesday morning depending on cardiac catheterization results              Patient currently chest pain-free on heparin drip.  Treating for NSTEMI.  Consultants:  Cardiology  Time spent: 26 minutes, case discussed with cardiology  [place]

## 2020-06-29 NOTE — Progress Notes (Signed)
Progress Note  Patient Name: Stacy Newman Date of Encounter: 06/29/2020  [facility] Cardiologist: Nelva Bush, MD   Subjective   Denies any further chest pain, no shortness of breath Remains on heparin infusion, slept better, back pain is better Willing to proceed with cardiac catheterization tomorrow morning  Inpatient Medications    Scheduled Meds: . aspirin EC  81 mg Oral Daily  . carvedilol  6.25 mg Oral BID  . influenza vaccine adjuvanted  0.5 mL Intramuscular Tomorrow-1000  . levothyroxine  50 mcg Oral QAC breakfast  . loratadine  10 mg Oral Daily  . losartan  25 mg Oral Daily  . pneumococcal 23 valent vaccine  0.5 mL Intramuscular Tomorrow-1000  . simvastatin  40 mg Oral q1800  . sodium chloride flush  3 mL Intravenous Q12H   Continuous Infusions: . sodium chloride    . heparin 750 Units/hr (06/29/20 0300)   PRN Meds: sodium chloride, acetaminophen, ALPRAZolam, nitroGLYCERIN, ondansetron (ZOFRAN) IV, oxyCODONE, sodium chloride flush   Vital Signs    Vitals:   06/28/20 2047 06/29/20 0410 06/29/20 0832 06/29/20 1100  BP: (!) 148/77 129/87 131/71 122/77  Pulse: 88 89 94 90  Resp: 18 16 18 18   Temp: 98.3 F (36.8 C) 98.5 F (36.9 C) 99.3 F (37.4 C) 99 F (37.2 C)  TempSrc: Oral Oral Oral Oral  SpO2: 100% 100% 99% 99%  Weight:  77.7 kg    Height:        Intake/Output Summary (Last 24 hours) at 06/29/2020 1211 Last data filed at 06/29/2020 1114 Gross per 24 hour  Intake 491.35 ml  Output 1950 ml  Net -1458.65 ml   Last 3 Weights 06/29/2020 06/28/2020 06/27/2020  Weight (lbs) 171 lb 6.4 oz 171 lb 11.8 oz 172 lb 6.4 oz  Weight (kg) 77.747 kg 77.9 kg 78.2 kg      Telemetry    Normal sinus rhythm- Personally Reviewed  ECG     - Personally Reviewed  Physical Exam   GEN: No acute distress.   Neck: No JVD Cardiac: RRR, no murmurs, rubs, or gallops.  Respiratory: Clear to auscultation bilaterally. GI: Soft, nontender,  non-distended  MS: No edema; No deformity. Neuro:  Nonfocal  Psych: Normal affect   Labs    High Sensitivity Troponin:   Recent Labs  Lab 06/27/20 1838 06/27/20 2052 06/27/20 2358 06/28/20 0722  TROPONINIHS 2,529* 2,858* 5,685* 15,828*      Chemistry Recent Labs  Lab 06/27/20 1838 06/27/20 2358 06/28/20 0722  NA 134*  --  136  K 3.4*  --  3.3*  CL 98  --  101  CO2 23  --  25  GLUCOSE 157*  --  133*  BUN 21  --  19  CREATININE 0.87  --  0.90  CALCIUM 8.4*  --  8.3*  PROT  --  6.5  --   ALBUMIN  --  3.2*  --   AST  --  97*  --   ALT  --  42  --   ALKPHOS  --  78  --   BILITOT  --  0.9  --   GFRNONAA >60  --  >60  ANIONGAP 13  --  10     Hematology Recent Labs  Lab 06/27/20 2358 06/28/20 0722 06/29/20 0433  WBC 4.3 4.3 5.4  RBC 3.23*  3.15* 3.32* 3.41*  HGB 9.2* 9.6* 9.7*  HCT 26.0* 27.1* 27.6*  MCV 80.5 81.6 80.9  MCH 28.5 28.9  28.4  MCHC 35.4 35.4 35.1  RDW 14.1 14.4 14.6  PLT 271 280 290    BNP Recent Labs  Lab 06/27/20 2358  BNP 1,194.3*     DDimer No results for input(s): DDIMER in the last 168 hours.   Radiology    DG Chest 2 View  Result Date: 06/27/2020 CLINICAL DATA:  74 year old female with chest pain. EXAM: CHEST - 2 VIEW COMPARISON:  None. FINDINGS: There is diffuse chronic interstitial coarsening. A small right pleural effusion is suspected. No focal consolidation or pneumothorax. Mild cardiomegaly. Atherosclerotic calcification of the aorta. No acute osseous pathology. IMPRESSION: Probable small right pleural effusion.  No focal consolidation. Electronically Signed   By: Anner Crete M.D.   On: 06/27/2020 19:21    Cardiac Studies  Echocardiogram pending   Patient Profile     74 year old woman with history of nonischemic cardiomyopathy by catheterization January 2019, most recent ejection fraction 40 to 45% in May 2021, presenting with chest pain, markedly elevated troponin  Assessment & Plan      Non-STEMI Markedly  elevated troponin in the setting of left-sided chest pain Significant EKG changes on repeat performed, diffuse T wave abnormality -Unable to exclude ischemia, though she does have a history of stress/nonischemic cardiomyopathy.   - profound stress at work recently, possibly a contributor -Would continue heparin infusion -Continue Coreg, losartan.  -Cardiac catheterization scheduled tomorrow morning, she is in agreement I have reviewed the risks, indications, and alternatives to cardiac catheterization, possible angioplasty, and stenting with the patient. Risks include but are not limited to bleeding, infection, vascular injury, stroke, myocardial infection, arrhythmia, kidney injury, radiation-related injury in the case of prolonged fluoroscopy use, emergency cardiac surgery, and death. The patient understands the risks of serious complication is 1-2 in 3893 with diagnostic cardiac cath and 1-2% or less with angioplasty/stenting.   Nonischemic cardiomyopathy Ejection fraction 35 to 40% in July 2019, nonischemic Ejection fraction 40 to 45% in May 2021 Continue carvedilol, losartan, we will look to add spironolactone as blood pressure tolerates -Significant stress at work -Repeat echocardiogram pending  Diabetes type 2 A1c 6.2 Lifestyle modification recommended  Anemia Slight drop compared to prior labs 2 years ago Stable hemoglobin 9.7 Iron studies within normal range  Back pain Likely musculoskeletal, worse she reports after sleeping in hospital bed -Back is better, sleeping in a recliner, more comfortable   Total encounter time more than 25 minutes  Greater than 50% was spent in counseling and coordination of care with the patient     For questions or updates, please contact [place] Please consult [email or web address] for contact info under        Signed, Ida Rogue, MD  06/29/2020, 12:11 PM

## 2020-06-29 NOTE — Plan of Care (Signed)

## 2020-06-29 NOTE — H&P (View-Only) (Signed)
Progress Note  Patient Name: Stacy Newman Date of Encounter: 06/29/2020  [facility] Cardiologist: Nelva Bush, MD   Subjective   Denies any further chest pain, no shortness of breath Remains on heparin infusion, slept better, back pain is better Willing to proceed with cardiac catheterization tomorrow morning  Inpatient Medications    Scheduled Meds: . aspirin EC  81 mg Oral Daily  . carvedilol  6.25 mg Oral BID  . influenza vaccine adjuvanted  0.5 mL Intramuscular Tomorrow-1000  . levothyroxine  50 mcg Oral QAC breakfast  . loratadine  10 mg Oral Daily  . losartan  25 mg Oral Daily  . pneumococcal 23 valent vaccine  0.5 mL Intramuscular Tomorrow-1000  . simvastatin  40 mg Oral q1800  . sodium chloride flush  3 mL Intravenous Q12H   Continuous Infusions: . sodium chloride    . heparin 750 Units/hr (06/29/20 0300)   PRN Meds: sodium chloride, acetaminophen, ALPRAZolam, nitroGLYCERIN, ondansetron (ZOFRAN) IV, oxyCODONE, sodium chloride flush   Vital Signs    Vitals:   06/28/20 2047 06/29/20 0410 06/29/20 0832 06/29/20 1100  BP: (!) 148/77 129/87 131/71 122/77  Pulse: 88 89 94 90  Resp: 18 16 18 18   Temp: 98.3 F (36.8 C) 98.5 F (36.9 C) 99.3 F (37.4 C) 99 F (37.2 C)  TempSrc: Oral Oral Oral Oral  SpO2: 100% 100% 99% 99%  Weight:  77.7 kg    Height:        Intake/Output Summary (Last 24 hours) at 06/29/2020 1211 Last data filed at 06/29/2020 1114 Gross per 24 hour  Intake 491.35 ml  Output 1950 ml  Net -1458.65 ml   Last 3 Weights 06/29/2020 06/28/2020 06/27/2020  Weight (lbs) 171 lb 6.4 oz 171 lb 11.8 oz 172 lb 6.4 oz  Weight (kg) 77.747 kg 77.9 kg 78.2 kg      Telemetry    Normal sinus rhythm- Personally Reviewed  ECG     - Personally Reviewed  Physical Exam   GEN: No acute distress.   Neck: No JVD Cardiac: RRR, no murmurs, rubs, or gallops.  Respiratory: Clear to auscultation bilaterally. GI: Soft, nontender,  non-distended  MS: No edema; No deformity. Neuro:  Nonfocal  Psych: Normal affect   Labs    High Sensitivity Troponin:   Recent Labs  Lab 06/27/20 1838 06/27/20 2052 06/27/20 2358 06/28/20 0722  TROPONINIHS 2,529* 2,858* 5,685* 15,828*      Chemistry Recent Labs  Lab 06/27/20 1838 06/27/20 2358 06/28/20 0722  NA 134*  --  136  K 3.4*  --  3.3*  CL 98  --  101  CO2 23  --  25  GLUCOSE 157*  --  133*  BUN 21  --  19  CREATININE 0.87  --  0.90  CALCIUM 8.4*  --  8.3*  PROT  --  6.5  --   ALBUMIN  --  3.2*  --   AST  --  97*  --   ALT  --  42  --   ALKPHOS  --  78  --   BILITOT  --  0.9  --   GFRNONAA >60  --  >60  ANIONGAP 13  --  10     Hematology Recent Labs  Lab 06/27/20 2358 06/28/20 0722 06/29/20 0433  WBC 4.3 4.3 5.4  RBC 3.23*  3.15* 3.32* 3.41*  HGB 9.2* 9.6* 9.7*  HCT 26.0* 27.1* 27.6*  MCV 80.5 81.6 80.9  MCH 28.5 28.9  28.4  MCHC 35.4 35.4 35.1  RDW 14.1 14.4 14.6  PLT 271 280 290    BNP Recent Labs  Lab 06/27/20 2358  BNP 1,194.3*     DDimer No results for input(s): DDIMER in the last 168 hours.   Radiology    DG Chest 2 View  Result Date: 06/27/2020 CLINICAL DATA:  74 year old female with chest pain. EXAM: CHEST - 2 VIEW COMPARISON:  None. FINDINGS: There is diffuse chronic interstitial coarsening. A small right pleural effusion is suspected. No focal consolidation or pneumothorax. Mild cardiomegaly. Atherosclerotic calcification of the aorta. No acute osseous pathology. IMPRESSION: Probable small right pleural effusion.  No focal consolidation. Electronically Signed   By: Anner Crete M.D.   On: 06/27/2020 19:21    Cardiac Studies  Echocardiogram pending   Patient Profile     74 year old woman with history of nonischemic cardiomyopathy by catheterization January 2019, most recent ejection fraction 40 to 45% in May 2021, presenting with chest pain, markedly elevated troponin  Assessment & Plan      Non-STEMI Markedly  elevated troponin in the setting of left-sided chest pain Significant EKG changes on repeat performed, diffuse T wave abnormality -Unable to exclude ischemia, though she does have a history of stress/nonischemic cardiomyopathy.   - profound stress at work recently, possibly a contributor -Would continue heparin infusion -Continue Coreg, losartan.  -Cardiac catheterization scheduled tomorrow morning, she is in agreement I have reviewed the risks, indications, and alternatives to cardiac catheterization, possible angioplasty, and stenting with the patient. Risks include but are not limited to bleeding, infection, vascular injury, stroke, myocardial infection, arrhythmia, kidney injury, radiation-related injury in the case of prolonged fluoroscopy use, emergency cardiac surgery, and death. The patient understands the risks of serious complication is 1-2 in 4097 with diagnostic cardiac cath and 1-2% or less with angioplasty/stenting.   Nonischemic cardiomyopathy Ejection fraction 35 to 40% in July 2019, nonischemic Ejection fraction 40 to 45% in May 2021 Continue carvedilol, losartan, we will look to add spironolactone as blood pressure tolerates -Significant stress at work -Repeat echocardiogram pending  Diabetes type 2 A1c 6.2 Lifestyle modification recommended  Anemia Slight drop compared to prior labs 2 years ago Stable hemoglobin 9.7 Iron studies within normal range  Back pain Likely musculoskeletal, worse she reports after sleeping in hospital bed -Back is better, sleeping in a recliner, more comfortable   Total encounter time more than 25 minutes  Greater than 50% was spent in counseling and coordination of care with the patient     For questions or updates, please contact [place] Please consult [email or web address] for contact info under        Signed, Ida Rogue, MD  06/29/2020, 12:11 PM

## 2020-06-29 NOTE — Consult Note (Signed)
ANTICOAGULATION CONSULT NOTE   Pharmacy Consult for Heparin  Indication: ACS / STEMI  Allergies  Allergen Reactions   Food Color Red [Red Dye] Swelling    Patient Measurements: Height: 5\' 4"  (162.6 cm) Weight: 77.9 kg (171 lb 11.8 oz) IBW/kg (Calculated) : 54.7 Heparin Dosing Weight: 71.7 kg   Vital Signs: Temp: 98.3 F (36.8 C) (11/13 2047) Temp Source: Oral (11/13 2047) BP: 148/77 (11/13 2047) Pulse Rate: 88 (11/13 2047)  Labs: Recent Labs    06/27/20 1838 06/27/20 1838 06/27/20 2052 06/27/20 2358 06/28/20 0722 06/28/20 1523 06/29/20 0112  HGB 9.3*   < >  --  9.2* 9.6*  --   --   HCT 27.2*  --   --  26.0* 27.1*  --   --   PLT 278  --   --  271 280  --   --   APTT  --   --  31  --   --   --   --   LABPROT  --   --  15.0  --  15.6*  --   --   INR  --   --  1.2  --  1.3*  --   --   HEPARINUNFRC  --   --   --   --  0.78* 0.60 0.43  CREATININE 0.87  --   --   --  0.90  --   --   TROPONINIHS 2,529*   < > 2,858* 5,685* 15,828*  --   --    < > = values in this interval not displayed.    Estimated Creatinine Clearance: 57.1 mL/min (by C-G formula based on SCr of 0.9 mg/dL).   Medications:  Confirmed with patient no PTA anticoagulants   Assessment: Pharmacy consulted for heparin dosing in a 57 YOF with presented with chest pain. Baseline troponin 2529 and CBC appropriate to start heparin with a bolus. Will watch Hgb while patient is on heparin.  Heparin DW:  71.7 kg  Give 4000 units bolus x 1 Start heparin infusion at 850 units/hr  Goal of Therapy:  Heparin level 0.3-0.7 units/ml Monitor platelets by anticoagulation protocol: Yes   Plan:  11/14  HL @ 0112 = 0.43 therapeutic x 2. Rate at 750 units/hr. Will CONTINUE heparin rate of 750 units/hr. Check anti-Xa level and CBC daily while on heparin, per protocol Continue to monitor H&H and platelets daily while on heparin   [place] Crane, Laxmi Choung A 06/29/2020,2:05 AM

## 2020-06-30 ENCOUNTER — Encounter
Admission: EM | Disposition: A | Discharge status: Home / Self Care | Payer: Self-pay | Source: Home / Self Care | Attending: Internal Medicine

## 2020-06-30 ENCOUNTER — Ambulatory Visit: Payer: BC Managed Care – PPO | Admitting: Family

## 2020-06-30 ENCOUNTER — Encounter: Payer: Self-pay | Admitting: Internal Medicine

## 2020-06-30 ENCOUNTER — Other Ambulatory Visit: Payer: Self-pay

## 2020-06-30 DIAGNOSIS — D509 Iron deficiency anemia, unspecified: Secondary | ICD-10-CM

## 2020-06-30 HISTORY — PX: CORONARY STENT INTERVENTION: CATH118234

## 2020-06-30 HISTORY — PX: LEFT HEART CATH AND CORONARY ANGIOGRAPHY: CATH118249

## 2020-06-30 LAB — CBC
HCT: 26.7 % — ABNORMAL LOW (ref 36.0–46.0)
Hemoglobin: 9 g/dL — ABNORMAL LOW (ref 12.0–15.0)
MCH: 28.1 pg (ref 26.0–34.0)
MCHC: 33.7 g/dL (ref 30.0–36.0)
MCV: 83.4 fL (ref 80.0–100.0)
Platelets: 270 10*3/uL (ref 150–400)
RBC: 3.2 MIL/uL — ABNORMAL LOW (ref 3.87–5.11)
RDW: 14.5 % (ref 11.5–15.5)
WBC: 5.5 10*3/uL (ref 4.0–10.5)
nRBC: 0 % (ref 0.0–0.2)

## 2020-06-30 LAB — HEPARIN LEVEL (UNFRACTIONATED): Heparin Unfractionated: 0.27 IU/mL — ABNORMAL LOW (ref 0.30–0.70)

## 2020-06-30 LAB — BASIC METABOLIC PANEL
Anion gap: 10 (ref 5–15)
BUN: 17 mg/dL (ref 8–23)
CO2: 23 mmol/L (ref 22–32)
Calcium: 8.4 mg/dL — ABNORMAL LOW (ref 8.9–10.3)
Chloride: 105 mmol/L (ref 98–111)
Creatinine, Ser: 0.85 mg/dL (ref 0.44–1.00)
GFR, Estimated: >60 mL/min (ref >60)
Glucose, Bld: 158 mg/dL — ABNORMAL HIGH (ref 70–99)
Potassium: 3.4 mmol/L — ABNORMAL LOW (ref 3.5–5.1)
Sodium: 138 mmol/L (ref 135–145)

## 2020-06-30 LAB — GLUCOSE, CAPILLARY
Glucose-Capillary: 142 mg/dL — ABNORMAL HIGH (ref 70–99)
Glucose-Capillary: 114 mg/dL — ABNORMAL HIGH (ref 70–99)
Glucose-Capillary: 168 mg/dL — ABNORMAL HIGH (ref 70–99)
Glucose-Capillary: 151 mg/dL — ABNORMAL HIGH (ref 70–99)
Glucose-Capillary: 160 mg/dL — ABNORMAL HIGH (ref 70–99)

## 2020-06-30 LAB — POCT ACTIVATED CLOTTING TIME: Activated Clotting Time: 290 seconds

## 2020-06-30 LAB — MAGNESIUM: Magnesium: 2 mg/dL (ref 1.7–2.4)

## 2020-06-30 LAB — TROPONIN I (HIGH SENSITIVITY): Troponin I (High Sensitivity): 5470 ng/L (ref <18)

## 2020-06-30 SURGERY — LEFT HEART CATH AND CORONARY ANGIOGRAPHY
Anesthesia: Moderate Sedation

## 2020-06-30 MED ORDER — MIDAZOLAM HCL 2 MG/2ML IJ SOLN
INTRAMUSCULAR | Status: AC
  Filled 2020-06-30: qty 2

## 2020-06-30 MED ORDER — HEPARIN (PORCINE) IN NACL 1000-0.9 UT/500ML-% IV SOLN
INTRAVENOUS | Status: DC | PRN
  Administered 2020-06-30: 1000 mL

## 2020-06-30 MED ORDER — TICAGRELOR 90 MG PO TABS
ORAL_TABLET | ORAL | Status: AC
  Filled 2020-06-30: qty 2

## 2020-06-30 MED ORDER — MIDAZOLAM HCL 2 MG/2ML IJ SOLN
INTRAMUSCULAR | Status: DC | PRN
  Administered 2020-06-30: 1 mg via INTRAVENOUS

## 2020-06-30 MED ORDER — SODIUM CHLORIDE 0.9 % IV SOLN: 250.0000 mL | INTRAVENOUS | Status: DC | PRN

## 2020-06-30 MED ORDER — ORAL CARE MOUTH RINSE
15.0000 mL | Freq: Two times a day (BID) | OROMUCOSAL | Status: DC
  Administered 2020-06-30 – 2020-07-01 (×2): 15 mL via OROMUCOSAL

## 2020-06-30 MED ORDER — POTASSIUM CHLORIDE 10 MEQ/100ML IV SOLN
10.0000 meq | INTRAVENOUS | Status: AC
  Administered 2020-06-30 (×2): 10 meq via INTRAVENOUS
  Filled 2020-06-30 (×2): qty 100

## 2020-06-30 MED ORDER — FENTANYL CITRATE (PF) 100 MCG/2ML IJ SOLN
INTRAMUSCULAR | Status: AC
  Filled 2020-06-30: qty 2

## 2020-06-30 MED ORDER — ASPIRIN 81 MG PO CHEW: 81.0000 mg | CHEWABLE_TABLET | ORAL | Status: DC

## 2020-06-30 MED ORDER — TICAGRELOR 90 MG PO TABS
90.0000 mg | ORAL_TABLET | Freq: Two times a day (BID) | ORAL | Status: DC
  Administered 2020-06-30 – 2020-07-01 (×2): 90 mg via ORAL
  Filled 2020-06-30 (×2): qty 1

## 2020-06-30 MED ORDER — HEPARIN SODIUM (PORCINE) 1000 UNIT/ML IJ SOLN
INTRAMUSCULAR | Status: DC | PRN
  Administered 2020-06-30 (×2): 4000 [IU] via INTRAVENOUS

## 2020-06-30 MED ORDER — SODIUM CHLORIDE 0.9% FLUSH: 3.0000 mL | INTRAVENOUS | Status: DC | PRN

## 2020-06-30 MED ORDER — FENTANYL CITRATE (PF) 100 MCG/2ML IJ SOLN
INTRAMUSCULAR | Status: DC | PRN
  Administered 2020-06-30: 25 ug via INTRAVENOUS

## 2020-06-30 MED ORDER — VERAPAMIL HCL 2.5 MG/ML IV SOLN
INTRAVENOUS | Status: DC | PRN
  Administered 2020-06-30: 5 mg via INTRA_ARTERIAL

## 2020-06-30 MED ORDER — HEPARIN (PORCINE) IN NACL 1000-0.9 UT/500ML-% IV SOLN
INTRAVENOUS | Status: AC
  Filled 2020-06-30: qty 1000

## 2020-06-30 MED ORDER — NITROGLYCERIN 0.4 MG SL SUBL
SUBLINGUAL_TABLET | SUBLINGUAL | Status: AC
  Administered 2020-06-30: 0.4 mg via SUBLINGUAL
  Filled 2020-06-30: qty 1

## 2020-06-30 MED ORDER — ASPIRIN 81 MG PO CHEW
CHEWABLE_TABLET | ORAL | Status: AC
  Filled 2020-06-30: qty 2

## 2020-06-30 MED ORDER — LIDOCAINE HCL (PF) 1 % IJ SOLN
INTRAMUSCULAR | Status: DC | PRN
  Administered 2020-06-30: 5 mL

## 2020-06-30 MED ORDER — ASPIRIN 81 MG PO CHEW
CHEWABLE_TABLET | ORAL | Status: DC | PRN
  Administered 2020-06-30: 162 mg via ORAL

## 2020-06-30 MED ORDER — TICAGRELOR 90 MG PO TABS
ORAL_TABLET | ORAL | Status: DC | PRN
  Administered 2020-06-30: 180 mg via ORAL

## 2020-06-30 MED ORDER — VERAPAMIL HCL 2.5 MG/ML IV SOLN
INTRAVENOUS | Status: AC
  Filled 2020-06-30: qty 2

## 2020-06-30 MED ORDER — ATORVASTATIN CALCIUM 80 MG PO TABS
80.0000 mg | ORAL_TABLET | Freq: Every day | ORAL | Status: DC
  Administered 2020-06-30 – 2020-07-01 (×2): 80 mg via ORAL
  Filled 2020-06-30 (×2): qty 1

## 2020-06-30 MED ORDER — SODIUM CHLORIDE 0.9% FLUSH: 3.0000 mL | Freq: Two times a day (BID) | INTRAVENOUS | Status: DC

## 2020-06-30 MED ORDER — ASPIRIN 81 MG PO CHEW: 81.0000 mg | CHEWABLE_TABLET | Freq: Every day | ORAL | Status: DC

## 2020-06-30 MED ORDER — LIDOCAINE HCL (PF) 1 % IJ SOLN
INTRAMUSCULAR | Status: AC
  Filled 2020-06-30: qty 30

## 2020-06-30 MED ORDER — FUROSEMIDE 10 MG/ML IJ SOLN
INTRAMUSCULAR | Status: AC
  Filled 2020-06-30: qty 4

## 2020-06-30 MED ORDER — SODIUM CHLORIDE 0.9 % IV SOLN: INTRAVENOUS | Status: DC

## 2020-06-30 MED ORDER — TICAGRELOR 90 MG PO TABS: 90.0000 mg | ORAL_TABLET | Freq: Two times a day (BID) | ORAL | Status: DC

## 2020-06-30 MED ORDER — HEPARIN SODIUM (PORCINE) 1000 UNIT/ML IJ SOLN
INTRAMUSCULAR | Status: AC
  Filled 2020-06-30: qty 1

## 2020-06-30 MED ORDER — IOHEXOL 300 MG/ML  SOLN
INTRAMUSCULAR | Status: DC | PRN
  Administered 2020-06-30: 120 mL

## 2020-06-30 MED ORDER — ENOXAPARIN SODIUM 40 MG/0.4ML ~~LOC~~ SOLN
40.0000 mg | SUBCUTANEOUS | Status: DC
  Administered 2020-07-01: 40 mg via SUBCUTANEOUS
  Filled 2020-06-30: qty 0.4

## 2020-06-30 SURGICAL SUPPLY — 20 items
BALLN TREK RX 2.75X12 (BALLOONS) ×3
BALLN WOLVERINE 3.00X10 (BALLOONS) ×3
BALLN ~~LOC~~ TREK RX 3.0X12 (BALLOONS) ×3
BALLN ~~LOC~~ TREK RX 3.25X12 (BALLOONS) ×3
BALLOON TREK RX 2.75X12 (BALLOONS) IMPLANT
BALLOON WOLVERINE 3.00X10 (BALLOONS) IMPLANT
BALLOON ~~LOC~~ TREK RX 3.0X12 (BALLOONS) IMPLANT
BALLOON ~~LOC~~ TREK RX 3.25X12 (BALLOONS) IMPLANT
CATH 5F 110X4 TIG (CATHETERS) ×2 IMPLANT
CATH LAUNCHER 6FR EBU3.5 (CATHETERS) ×2 IMPLANT
DEVICE RAD TR BAND REGULAR (VASCULAR PRODUCTS) ×2 IMPLANT
GLIDESHEATH SLEND SS 6F .021 (SHEATH) ×2 IMPLANT
GUIDEWIRE INQWIRE 1.5J.035X260 (WIRE) IMPLANT
INQWIRE 1.5J .035X260CM (WIRE) ×3
KIT ENCORE 26 ADVANTAGE (KITS) ×2 IMPLANT
KIT LHK - MID 3VCOMP MANIF (MISCELLANEOUS) ×2 IMPLANT
KIT MANI 3VAL PERCEP (MISCELLANEOUS) ×2 IMPLANT
PACK CARDIAC CATH (CUSTOM PROCEDURE TRAY) ×3 IMPLANT
STENT RESOLUTE ONYX 3.0X15 (Permanent Stent) ×2 IMPLANT
WIRE RUNTHROUGH .014X180CM (WIRE) ×2 IMPLANT

## 2020-06-30 NOTE — CV Procedure (Signed)
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  Status post PCI and drug-eluting stent placement to the proximal left circumflex.  Apical LAD occlusion to be treated medically. LVEDP was 28.  Avoid IV fluids. Likely discharge home tomorrow. Full report to follow.

## 2020-06-30 NOTE — Plan of Care (Signed)
  Problem: Education: Goal: Knowledge of General Education information will improve Description: Including pain rating scale, medication(s)/side effects and non-pharmacologic comfort measures Outcome: Progressing   Problem: Health Behavior/Discharge Planning: Goal: Ability to manage health-related needs will improve Outcome: Progressing   Problem: Clinical Measurements: Goal: Ability to maintain clinical measurements within normal limits will improve Outcome: Progressing Goal: Will remain free from infection Outcome: Progressing Goal: Diagnostic test results will improve Outcome: Progressing Goal: Respiratory complications will improve Outcome: Progressing Goal: Cardiovascular complication will be avoided Outcome: Progressing   Problem: Activity: Goal: Risk for activity intolerance will decrease Outcome: Progressing   Problem: Nutrition: Goal: Adequate nutrition will be maintained Outcome: Progressing   Problem: Coping: Goal: Level of anxiety will decrease Outcome: Progressing   Problem: Elimination: Goal: Will not experience complications related to bowel motility Outcome: Progressing Goal: Will not experience complications related to urinary retention Outcome: Progressing   Problem: Pain Managment: Goal: General experience of comfort will improve Outcome: Progressing   Problem: Safety: Goal: Ability to remain free from injury will improve Outcome: Progressing   Problem: Skin Integrity: Goal: Risk for impaired skin integrity will decrease Outcome: Progressing   Problem: Education: Goal: Ability to demonstrate management of disease process will improve Outcome: Progressing Goal: Ability to verbalize understanding of medication therapies will improve Outcome: Progressing Goal: Individualized Educational Video(s) Outcome: Progressing   Problem: Activity: Goal: Capacity to carry out activities will improve Outcome: Progressing   Problem: Cardiac: Goal:  Ability to achieve and maintain adequate cardiopulmonary perfusion will improve Outcome: Progressing   Problem: Education: Goal: Ability to describe self-care measures that may prevent or decrease complications (Diabetes Survival Skills Education) will improve Outcome: Progressing Goal: Individualized Educational Video(s) Outcome: Progressing   Problem: Coping: Goal: Ability to adjust to condition or change in health will improve Outcome: Progressing   Problem: Fluid Volume: Goal: Ability to maintain a balanced intake and output will improve Outcome: Progressing   Problem: Health Behavior/Discharge Planning: Goal: Ability to identify and utilize available resources and services will improve Outcome: Progressing Goal: Ability to manage health-related needs will improve Outcome: Progressing   Problem: Metabolic: Goal: Ability to maintain appropriate glucose levels will improve Outcome: Progressing   Problem: Nutritional: Goal: Maintenance of adequate nutrition will improve Outcome: Progressing Goal: Progress toward achieving an optimal weight will improve Outcome: Progressing   Problem: Skin Integrity: Goal: Risk for impaired skin integrity will decrease Outcome: Progressing   Problem: Tissue Perfusion: Goal: Adequacy of tissue perfusion will improve Outcome: Progressing   Problem: Education: Goal: Understanding of cardiac disease, CV risk reduction, and recovery process will improve Outcome: Progressing Goal: Understanding of medication regimen will improve Outcome: Progressing Goal: Individualized Educational Video(s) Outcome: Progressing   Problem: Activity: Goal: Ability to tolerate increased activity will improve Outcome: Progressing   Problem: Cardiac: Goal: Ability to achieve and maintain adequate cardiopulmonary perfusion will improve Outcome: Progressing Goal: Vascular access site(s) Level 0-1 will be maintained Outcome: Progressing   Problem: Health  Behavior/Discharge Planning: Goal: Ability to safely manage health-related needs after discharge will improve Outcome: Progressing   Problem: Education: Goal: Individualized Educational Video(s) Outcome: Progressing   Problem: Activity: Goal: Ability to return to baseline activity level will improve Outcome: Progressing   Problem: Cardiovascular: Goal: Ability to achieve and maintain adequate cardiovascular perfusion will improve Outcome: Progressing Goal: Vascular access site(s) Level 0-1 will be maintained Outcome: Progressing   Problem: Health Behavior/Discharge Planning: Goal: Ability to safely manage health-related needs after discharge will improve Outcome: Progressing

## 2020-06-30 NOTE — Progress Notes (Signed)
Patient ID: Stacy Stacy, female   DOB: 04-28-1946, 74 y.o.   MRN: [id number] [facility] PROGRESS NOTE  [name] TOI:[id number] DOB: Mar 09, 1946 DOA: 06/27/2020 PCP: Lonn Georgia, FNP  HPI/Subjective: Patient did have a little shortness of breath after cardiac catheterization.  Today had a stent placed in the left circumflex.  No complaints of chest pain or back pain.  Admitted with NSTEMI.  Objective: Vitals:   06/30/20 1115 06/30/20 1130  BP:  (!) 159/107  Pulse: 79 90  Resp: 18 17  Temp:    SpO2: 100% 99%    Intake/Output Summary (Last 24 hours) at 06/30/2020 1214 Last data filed at 06/30/2020 0400 Gross per 24 hour  Intake 757.53 ml  Output 1700 ml  Net -942.47 ml   Filed Weights   06/28/20 0431 06/29/20 0410 06/30/20 0414  Weight: 77.9 kg 77.7 kg 77.3 kg    ROS: Review of Systems  Respiratory: Positive for shortness of breath.   Cardiovascular: Negative for chest pain.  Gastrointestinal: Negative for abdominal pain, nausea and vomiting.  Musculoskeletal: Negative for back pain.   Exam: Physical Exam HENT:     Head: Normocephalic.     Mouth/Throat:     Pharynx: No oropharyngeal exudate.  Eyes:     General: Lids are normal.     Conjunctiva/sclera: Conjunctivae normal.     Pupils: Pupils are equal, round, and reactive to light.  Cardiovascular:     Rate and Rhythm: Normal rate and regular rhythm.     Heart sounds: Normal heart sounds, S1 normal and S2 normal.  Pulmonary:     Breath sounds: No decreased breath sounds, wheezing, rhonchi or rales.  Abdominal:     Palpations: Abdomen is soft.     Tenderness: There is no abdominal tenderness.  Musculoskeletal:     Right ankle: No swelling.     Left ankle: No swelling.  Skin:    General: Skin is warm.     Findings: No rash.  Neurological:     Mental Status: She is alert and oriented to person, place, and time.       Data Reviewed: Basic Metabolic Panel: Recent  Labs  Lab 06/27/20 1838 06/27/20 2358 06/28/20 0722 06/30/20 0534  NA 134*  --  136 138  K 3.4*  --  3.3* 3.4*  CL 98  --  101 105  CO2 23  --  25 23  GLUCOSE 157*  --  133* 158*  BUN 21  --  19 17  CREATININE 0.87  --  0.90 0.85  CALCIUM 8.4*  --  8.3* 8.4*  MG  --  1.6*  --  2.0   Liver Function Tests: Recent Labs  Lab 06/27/20 2358  AST 97*  ALT 42  ALKPHOS 78  BILITOT 0.9  PROT 6.5  ALBUMIN 3.2*   CBC: Recent Labs  Lab 06/27/20 1838 06/27/20 2358 06/28/20 0722 06/29/20 0433 06/30/20 0534  WBC 3.6* 4.3 4.3 5.4 5.5  NEUTROABS  --  2.5  --   --   --   HGB 9.3* 9.2* 9.6* 9.7* 9.0*  HCT 27.2* 26.0* 27.1* 27.6* 26.7*  MCV 83.4 80.5 81.6 80.9 83.4  PLT 278 271 280 290 270   BNP (last 3 results) Recent Labs    06/27/20 2358  BNP 1,194.3*    CBG: Recent Labs  Lab 06/29/20 0828 06/29/20 1634 06/29/20 2111 06/30/20 0422 06/30/20 0751  GLUCAP 133* 127* 173* 142* 114*    Recent Results (from  the past 240 hour(s))  Respiratory Panel by RT PCR (Flu A&B, Covid) - Nasopharyngeal Swab     Status: None   Collection Time: 06/27/20  8:52 PM   Specimen: Nasopharyngeal Swab  Result Value Ref Range Status   SARS Coronavirus 2 by RT PCR NEGATIVE NEGATIVE Final    Comment: (NOTE) SARS-CoV-2 target nucleic acids are NOT DETECTED.  The SARS-CoV-2 RNA is generally detectable in upper respiratoy specimens during the acute phase of infection. The lowest concentration of SARS-CoV-2 viral copies this assay can detect is 131 copies/mL. A negative result does not preclude SARS-Cov-2 infection and should not be used as the sole basis for treatment or other patient management decisions. A negative result may occur with  improper specimen collection/handling, submission of specimen other than nasopharyngeal swab, presence of viral mutation(s) within the areas targeted by this assay, and inadequate number of viral copies (<131 copies/mL). A negative result must be combined  with clinical observations, patient history, and epidemiological information. The expected result is Negative.  Fact Sheet for Patients:  PinkCheek.be  Fact Sheet for Healthcare Providers:  GravelBags.it  This test is no t yet approved or cleared by the [place] FDA and  has been authorized for detection and/or diagnosis of SARS-CoV-2 by FDA under an Emergency Use Authorization (EUA). This EUA will remain  in effect (meaning this test can be used) for the duration of the COVID-19 declaration under Section 564(b)(1) of the Act, 21 U.S.C. section 360bbb-3(b)(1), unless the authorization is terminated or revoked sooner.     Influenza A by PCR NEGATIVE NEGATIVE Final   Influenza B by PCR NEGATIVE NEGATIVE Final    Comment: (NOTE) The Xpert Xpress SARS-CoV-2/FLU/RSV assay is intended as an aid in  the diagnosis of influenza from Nasopharyngeal swab specimens and  should not be used as a sole basis for treatment. Nasal washings and  aspirates are unacceptable for Xpert Xpress SARS-CoV-2/FLU/RSV  testing.  Fact Sheet for Patients: PinkCheek.be  Fact Sheet for Healthcare Providers: GravelBags.it  This test is not yet approved or cleared by the [place] FDA and  has been authorized for detection and/or diagnosis of SARS-CoV-2 by  FDA under an Emergency Use Authorization (EUA). This EUA will remain  in effect (meaning this test can be used) for the duration of the  Covid-19 declaration under Section 564(b)(1) of the Act, 21  U.S.C. section 360bbb-3(b)(1), unless the authorization is  terminated or revoked. Performed at [facility], [address], [place], [place] [address]      Scheduled Meds: . [MAR Hold] aspirin EC  81 mg Oral Daily  . [MAR Hold] carvedilol  6.25 mg Oral BID  . furosemide      . influenza vaccine adjuvanted  0.5 mL  Intramuscular Tomorrow-1000  . [MAR Hold] levothyroxine  50 mcg Oral QAC breakfast  . [MAR Hold] loratadine  10 mg Oral Daily  . [MAR Hold] losartan  25 mg Oral Daily  . pneumococcal 23 valent vaccine  0.5 mL Intramuscular Tomorrow-1000  . [MAR Hold] simvastatin  40 mg Oral q1800  . [MAR Hold] sodium chloride flush  3 mL Intravenous Q12H  . [MAR Hold] sodium chloride flush  3 mL Intravenous Q12H  . sodium chloride flush  3 mL Intravenous Q12H   Continuous Infusions: . [MAR Hold] sodium chloride    . sodium chloride    . heparin 750 Units/hr (06/29/20 0300)  . potassium chloride 10 mEq (06/30/20 1153)    Assessment/Plan:  1.  NSTEMI.  Cardiac catheterization was done on 06/30/2020 and a stent was placed in the left circumflex.  Distal LAD was occluded and that would be treated medically.  Patient on aspirin.  Cardiology to choose medication to keep the stent open.  Continue statin. 2. Acute on chronic systolic congestive heart failure.  Continue Coreg and losartan. 3. Hypokalemia replace potassium today. 4. Iron deficiency anemia.  Hemoglobin 9.0 continue to trend hemoglobin and follow-up as outpatient. 5. Hyperlipidemia unspecified on simvastatin.  Cardiology may want to change to Lipitor or Zocor. 6. Hypothyroidism unspecified on levothyroxine 7. Impaired fasting glucose.     Code Status:     Code Status Orders  (From admission, onward)         Start     Ordered   06/27/20 2259  Full code  Continuous        06/27/20 2258        Code Status History    Date Active Date Inactive Code Status Order ID Comments User Context   02/28/2018 1018 02/28/2018 1540 Full Code [id number]  End, Harrell Gave, MD Inpatient   Advance Care Planning Activity     Family Communication: Granddaughter at the bedside Disposition Plan: Status is: Inpatient  Dispo: The patient is from: Home              Anticipated d/c is to: Home              Anticipated d/c date is: Potential of 07/01/2020               Patient had a stent today and we watched overnight after procedure and potentially go home tomorrow if feeling okay.  Consultants:  Cardiology  Procedures:  Cardiac catheterization  Time spent: 28 minutes  [place]

## 2020-06-30 NOTE — Interval H&P Note (Signed)
History and Physical Interval Note:  11/15/2021Cath Lab Visit (complete for each Cath Lab visit)  Clinical Evaluation Leading to the Procedure:   ACS: Yes.    Non-ACS:  n/a    8:34 AM  Stacy Newman  has presented today for surgery, with the diagnosis of Non ST Elevation Myocardial infarction.  The various methods of treatment have been discussed with the patient and family. After consideration of risks, benefits and other options for treatment, the patient has consented to  Procedure(s): LEFT HEART CATH AND CORONARY ANGIOGRAPHY (N/A) as a surgical intervention.  The patient's history has been reviewed, patient examined, no change in status, stable for surgery.  I have reviewed the patient's chart and labs.  Questions were answered to the patient's satisfaction.     [name]

## 2020-06-30 NOTE — Progress Notes (Addendum)
Progress Note  Patient Name: Stacy Newman Date of Encounter: 06/30/2020  Primary Cardiologist: Nelva Bush, MD   Subjective   S/p earlier [facility] and PCI.  Denies any chest pain.    Denies any pain at right radial arteriotomy site.  Dressing clean, dry, and intact.    Reports earlier shortness of breath at approximately 11 AM.  States that her shortness of breath is improving.  We discussed recommendation to discontinue her fluids, given her elevated pressures by catheterization as below.  We also discussed follow-up in the office and escalation of GDMT.  Inpatient Medications    Scheduled Meds: . aspirin EC  81 mg Oral Daily  . carvedilol  6.25 mg Oral BID  . furosemide      . influenza vaccine adjuvanted  0.5 mL Intramuscular Tomorrow-1000  . levothyroxine  50 mcg Oral QAC breakfast  . loratadine  10 mg Oral Daily  . losartan  25 mg Oral Daily  . pneumococcal 23 valent vaccine  0.5 mL Intramuscular Tomorrow-1000  . sodium chloride flush  3 mL Intravenous Q12H  . sodium chloride flush  3 mL Intravenous Q12H  . sodium chloride flush  3 mL Intravenous Q12H   Continuous Infusions: . sodium chloride    . sodium chloride     PRN Meds: sodium chloride, sodium chloride, acetaminophen, ALPRAZolam, nitroGLYCERIN, ondansetron (ZOFRAN) IV, oxyCODONE, sodium chloride flush, sodium chloride flush   Vital Signs    Vitals:   06/30/20 1200 06/30/20 1230 06/30/20 1408 06/30/20 1410  BP: 134/83 (!) 150/93 120/71 120/71  Pulse: 76 84 88 86  Resp: (!) 21 19 14    Temp:   98.2 F (36.8 C) 98.2 F (36.8 C)  TempSrc:    Oral  SpO2: 99% 100% 100% 100%  Weight:      Height:        Intake/Output Summary (Last 24 hours) at 06/30/2020 1420 Last data filed at 06/30/2020 0400 Gross per 24 hour  Intake 517.53 ml  Output 1700 ml  Net -1182.47 ml   Last 3 Weights 06/30/2020 06/29/2020 06/28/2020  Weight (lbs) 170 lb 6.7 oz 171 lb 6.4 oz 171 lb 11.8 oz  Weight (kg) 77.3  kg 77.747 kg 77.9 kg      Telemetry    NSR- Personally Reviewed  ECG    No new tracings.- Personally Reviewed  Physical Exam   GEN: No acute distress.  Resting comfortably in bed. Neck: No JVD Cardiac: RRR, 1/6 systolic murmur.  No rubs, or gallops.  Right radial arteriotomy site without signs of swelling or infection.  Not TTP.  Dressing clean, dry, and intact. Respiratory:  Very slight bibasilar crackles. GI: Soft, nontender, non-distended  MS: No edema; No deformity. Neuro:  Nonfocal  Psych: Normal affect   Labs    High Sensitivity Troponin:   Recent Labs  Lab 06/27/20 1838 06/27/20 2052 06/27/20 2358 06/28/20 0722 06/30/20 0535  TROPONINIHS 2,529* 2,858* 5,685* 15,828* 5,470*      Chemistry Recent Labs  Lab 06/27/20 1838 06/27/20 2358 06/28/20 0722 06/30/20 0534  NA 134*  --  136 138  K 3.4*  --  3.3* 3.4*  CL 98  --  101 105  CO2 23  --  25 23  GLUCOSE 157*  --  133* 158*  BUN 21  --  19 17  CREATININE 0.87  --  0.90 0.85  CALCIUM 8.4*  --  8.3* 8.4*  PROT  --  6.5  --   --  ALBUMIN  --  3.2*  --   --   AST  --  97*  --   --   ALT  --  42  --   --   ALKPHOS  --  78  --   --   BILITOT  --  0.9  --   --   GFRNONAA >60  --  >60 >60  ANIONGAP 13  --  10 10     Hematology Recent Labs  Lab 06/28/20 0722 06/29/20 0433 06/30/20 0534  WBC 4.3 5.4 5.5  RBC 3.32* 3.41* 3.20*  HGB 9.6* 9.7* 9.0*  HCT 27.1* 27.6* 26.7*  MCV 81.6 80.9 83.4  MCH 28.9 28.4 28.1  MCHC 35.4 35.1 33.7  RDW 14.4 14.6 14.5  PLT 280 290 270    BNP Recent Labs  Lab 06/27/20 2358  BNP 1,194.3*     DDimer No results for input(s): DDIMER in the last 168 hours.   Radiology    ECHOCARDIOGRAM COMPLETE  Result Date: 06/29/2020    ECHOCARDIOGRAM REPORT   Patient Name:   Stacy Newman [facility] Date of Exam: 06/29/2020 Medical Rec #:  [id number]                 Height:       64.0 in Accession #:    [id number]                Weight:       171.4 lb Date of Birth:   1946-01-12                 BSA:          1.832 m Patient Age:    74 years                  BP:           120/80 mmHg Patient Gender: F                         HR:           80 bpm. Exam Location:  [facility] Procedure: 2D Echo, Cardiac Doppler, Color Doppler and Strain Analysis Indications:     NSTEMI; R07.89 Other chest pain; I20.0 Unstable angina; I42.0                  Dilated cardiomyopathy  History:         Patient has prior history of Echocardiogram examinations.                  Cardiomegaly and Cardiomyopathy, NICM; Signs/Symptoms:Chest                  Pain.  Sonographer:     Arville Go Referring Phys:  [id number] Loletha Grayer Diagnosing Phys: Ida Rogue MD  Sonographer Comments: Global longitudinal strain was attempted. IMPRESSIONS  1. Left ventricular ejection fraction, by estimation, is 30 to 35%. The left ventricle has moderately decreased function. The left ventricle demonstrates global hypokinesis. The left ventricular internal cavity size was mildly dilated. Left ventricular diastolic parameters are consistent with Grade I diastolic dysfunction (impaired relaxation). The average left ventricular global longitudinal strain is -6.7 %. The global longitudinal strain is abnormal.  2. Right ventricular systolic function is normal. The right ventricular size is normal. There is mildly elevated pulmonary artery systolic pressure. The estimated right ventricular systolic pressure is 15.0 mmHg.  3. The mitral valve is normal in structure. Mild  to moderate mitral valve regurgitation.  4. The inferior vena cava is dilated in size with <50% respiratory variability, suggesting right atrial pressure of 15 mmHg. FINDINGS  Left Ventricle: Left ventricular ejection fraction, by estimation, is 30 to 35%. The left ventricle has moderately decreased function. The left ventricle demonstrates global hypokinesis. The average left ventricular global longitudinal strain is -6.7 %.  The global longitudinal strain is  abnormal. The left ventricular internal cavity size was mildly dilated. There is no left ventricular hypertrophy. Left ventricular diastolic parameters are consistent with Grade I diastolic dysfunction (impaired relaxation). Right Ventricle: The right ventricular size is normal. No increase in right ventricular wall thickness. Right ventricular systolic function is normal. There is mildly elevated pulmonary artery systolic pressure. The tricuspid regurgitant velocity is 2.63  m/s, and with an assumed right atrial pressure of 15 mmHg, the estimated right ventricular systolic pressure is 41.6 mmHg. Left Atrium: Left atrial size was normal in size. Right Atrium: Right atrial size was normal in size. Pericardium: There is no evidence of pericardial effusion. Mitral Valve: The mitral valve is normal in structure. Mild to moderate mitral valve regurgitation. No evidence of mitral valve stenosis. Tricuspid Valve: The tricuspid valve is normal in structure. Tricuspid valve regurgitation is mild . No evidence of tricuspid stenosis. Aortic Valve: The aortic valve is normal in structure. Aortic valve regurgitation is not visualized. No aortic stenosis is present. Aortic valve peak gradient measures 9.7 mmHg. Pulmonic Valve: The pulmonic valve was normal in structure. Pulmonic valve regurgitation is not visualized. No evidence of pulmonic stenosis. Aorta: The aortic root is normal in size and structure. Venous: The inferior vena cava is dilated in size with less than 50% respiratory variability, suggesting right atrial pressure of 15 mmHg. IAS/Shunts: No atrial level shunt detected by color flow Doppler.  LEFT VENTRICLE PLAX 2D LVIDd:         4.74 cm     Diastology LVIDs:         3.94 cm     LV e' medial:    6.09 cm/s LV PW:         0.95 cm     LV E/e' medial:  15.7 LV IVS:        0.84 cm     LV e' lateral:   4.68 cm/s LVOT diam:     1.80 cm     LV E/e' lateral: 20.4 LV SV:         30 LV SV Index:   16          2D Longitudinal  Strain LVOT Area:     2.54 cm    2D Strain GLS (A2C):   -6.7 %                            2D Strain GLS (A3C):   -3.3 %                            2D Strain GLS (A4C):   -10.3 % LV Volumes (MOD)           2D Strain GLS Avg:     -6.7 % LV vol d, MOD A2C: 90.2 ml LV vol d, MOD A4C: 76.0 ml LV vol s, MOD A2C: 59.4 ml LV vol s, MOD A4C: 46.3 ml 3D Volume EF: LV SV MOD A2C:     30.8 ml 3D EF:  40 % LV SV MOD A4C:     76.0 ml LV EDV:       161 ml LV SV MOD BP:      30.2 ml LV ESV:       96 ml                            LV SV:        64 ml RIGHT VENTRICLE RV Basal diam:  2.45 cm RV S prime:     13.60 cm/s TAPSE (M-mode): 2.0 cm LEFT ATRIUM             Index       RIGHT ATRIUM           Index LA diam:        3.30 cm 1.80 cm/m  RA Area:     11.90 cm LA Vol (A2C):   29.1 ml 15.88 ml/m RA Volume:   26.90 ml  14.68 ml/m LA Vol (A4C):   34.8 ml 18.99 ml/m LA Biplane Vol: 34.7 ml 18.94 ml/m  AORTIC VALVE                PULMONIC VALVE AV Area (Vmax): 1.11 cm    PV Vmax:       0.95 m/s AV Vmax:        156.00 cm/s PV Peak grad:  3.6 mmHg AV Peak Grad:   9.7 mmHg LVOT Vmax:      67.90 cm/s LVOT Vmean:     46.300 cm/s LVOT VTI:       0.117 m  AORTA Ao Root diam: 2.20 cm Ao Asc diam:  2.50 cm MITRAL VALVE                TRICUSPID VALVE MV Area (PHT): 6.83 cm     TV Peak grad:   25.5 mmHg MV Decel Time: 111 msec     TV Vmax:        2.52 m/s MV E velocity: 95.50 cm/s   TR Peak grad:   27.7 mmHg MV A velocity: 111.00 cm/s  TR Vmax:        263.00 cm/s MV E/A ratio:  0.86                             SHUNTS                             Systemic VTI:  0.12 m                             Systemic Diam: 1.80 cm Ida Rogue MD Electronically signed by Ida Rogue MD Signature Date/Time: 06/29/2020/3:52:40 PM    Final     Cardiac Studies   LHC 06/30/20 Pending official report Cardiac cath showed occluded apical LAD as well as 90% proximal left circumflex stenosis.  Status post PCI and drug-eluting stent placement to the  proximal left circumflex.  Apical LAD occlusion to be treated medically. LVEDP was 28.  Avoid IV fluids. Likely discharge home tomorrow. Full report to follow.  Echo 06/30/20 1. Left ventricular ejection fraction, by estimation, is 30 to 35%. The  left ventricle has moderately decreased function. The left ventricle  demonstrates global hypokinesis. The left ventricular internal cavity size  was mildly dilated. Left  ventricular  diastolic parameters are consistent with Grade I diastolic dysfunction  (impaired relaxation). The average left ventricular global longitudinal  strain is -6.7 %. The global longitudinal strain is abnormal.  2. Right ventricular systolic function is normal. The right ventricular  size is normal. There is mildly elevated pulmonary artery systolic  pressure. The estimated right ventricular systolic pressure is 64.3 mmHg.  3. The mitral valve is normal in structure. Mild to moderate mitral valve  regurgitation.  4. The inferior vena cava is dilated in size with <50% respiratory  variability, suggesting right atrial pressure of 15 mmHg.  Echo 12/25/19 1. Left ventricular ejection fraction, by estimation, is 40 to 45%. The  left ventricle has mild to moderately decreased function. The left  ventricle demonstrates global hypokinesis. Left ventricular diastolic  parameters are consistent with Grade I  diastolic dysfunction (impaired relaxation).  2. Right ventricular systolic function is normal. The right ventricular  size is normal.  3. The mitral valve is degenerative. Mild to moderate mitral valve  regurgitation. No evidence of mitral stenosis.  4. The aortic valve is tricuspid. Aortic valve regurgitation is not  visualized. Mild aortic valve sclerosis is present, with no evidence of  aortic valve stenosis.  5. The inferior vena cava is normal in size with greater than 50%  respiratory variability, suggesting right atrial pressure of 3 mmHg.    R/LHC 02/2018 Conclusions: 1. Mild to moderate, non-obstructive coronary artery disease with 20-30% mid LAD and proximal LCx stenoses, as well as 40% proximal D1 lesion. Findings are consistent with non-ischemic cardiomyopathy. 2. Mildly to moderately elevated left heart filling pressures. 3. Mildly elevated right heart filling pressures. 4. Moderate pulmonary hypertension. 5. Reduced Fick cardiac output/index. Recommendations: 1. Medical therapy and risk factor modification to prevent progression of non-obstructive CAD. 2. Discontinue HCTZ and start furosemide 40 mg daily. 3. BMP in the office in ~1 week; clinic follow-up in ~2 weeks Recommend Aspirin 81mg  daily for moderate CAD.  Patient Profile     74 y.o. female hx of HFrEF secondary to nonischemic cardiomyopathy, DM2, hypertension, hyperlipidemia, hypothyroidism, and remote left breast cancer s/p lumpectomy and chemoradiation, and who is being seen today for the evaluation of NSTEMI s/p PCI as above.   Assessment & Plan    NSTEMI CAD with PCI to pLCx 06/30/20 --No current CP. Reports an episode of chest pain 11/12 that lasted for approximately 1 hour with findings/story concerning for cardiac ischemia.  --HS peaked 15,828 on 06/28/20, subsequently down-trending. --Echo with EF further reduced as above at 30-35%, LV global hypokinesis, RAP 17mmHg, elevated PASP with RVSP 42.21mmHg, moderate MR, G1DD.  --S/p LHC as above with PCI to LCx and apical LAD to be treated medically. LVEDP 28 with IV Lasix 40 mg administered once recommendation to avoid IVF.  Right radial arteriotomy site without signs of infection or hematoma and dressing clean, dry, and intact. --Cath pending official report. --Discontinued IVF.  Ordered KVO to maintain IV access.   --Daily CBC, BMET. --Replete electrolytes as needed. --Continue medical management with DAPT: Brilinta and ASA for at least 1 year. Continue BB, statin, and PRN SL nitro. Escalation of  GDMT as tolerated.  --Recommend transition from Losartan to [facility] before discharge given room in pressure. Addition of spironolactone recommended now or as an outpatient if renal function allows. --Will need follow-up in the office within 1 to 2 weeks.  Hypokalemia, hypomagnesium --Potassium 3.4. --Recommend repletion with goal K 4.0. --Check Mg with goal 2.0. --Consider addition of Entresto/spironolactone  given room in BP, reduced EF, and low K+. --Daily BMET.  NSVT --Seen earlier on telemetry.   --Replete electrolytes with potassium goal 4.0, magnesium goal 2.0. --Continue Coreg 6.25mg  BID.   HFrEF, NICM --Reports earlier shortness of breath, approximately 11 AM. --BNP 1194.3 at admission. --Echo as above with pulmonary HTN, elevated RVSP/PASP/RAP. --11/15 cath with elevated LVEDP and recommendation to avoid IVF. --IV Lasix 40 mg administered once s/p 11/15 catheterization. --Discontinued IVF running in room at time of rounding this afternoon. --Avoid IVF given elevated heart pressures as above. --Continue Coreg. Escalation of GDMT with spironolactone and Entresto recommended. Started losartan for additional BP support during lisinopril washout over the weekend. Transition from [facility] to [facility] as tolerated before discharge.  Addition of spironolactone as tolerated now or at outpatient follow-up.  Recommend consider discharge with low dose diuretic, given elevated LVEDP as above and is required IV Lasix 40 mg x 1 during catheterization.  HTN  --BP controlled. --Goal BP 130/80 or lower. --Started on Losartan for additional BP support.  Recommend consider transition from [facility] to [facility] before discharge.  Addition of spironolactone as tolerated.  Continue Coreg.   HLD --LDL 106, total cholesterol 162.   --Not at goal of LDL below 70. --Recommend escalation of statin with high intensity atorvastatin 80mg  +/- Zetia with repeat lipid and liver function in 6 to 8 weeks as an  outpatient.  Anemia --On review of EMR, Hgb stable when compared with previous labs.  DM2 --SSI, glycemic control recommended. --Continue losartan for comorbid HTN.  As above, consider transition from losartan to [facility] before discharge.  For questions or updates, please contact [place] Please consult [email or web address] for contact info under        Signed, Arvil Chaco, PA-C  06/30/2020, 2:20 PM

## 2020-07-01 ENCOUNTER — Telehealth: Payer: Self-pay | Admitting: Internal Medicine

## 2020-07-01 ENCOUNTER — Encounter: Payer: Self-pay | Admitting: Cardiovascular Disease

## 2020-07-01 LAB — CBC
HCT: 32.4 % — ABNORMAL LOW (ref 36.0–46.0)
Hemoglobin: 11.1 g/dL — ABNORMAL LOW (ref 12.0–15.0)
MCH: 28.5 pg (ref 26.0–34.0)
MCHC: 34.3 g/dL (ref 30.0–36.0)
MCV: 83.1 fL (ref 80.0–100.0)
Platelets: 308 10*3/uL (ref 150–400)
RBC: 3.9 MIL/uL (ref 3.87–5.11)
RDW: 14.5 % (ref 11.5–15.5)
WBC: 5.5 10*3/uL (ref 4.0–10.5)
nRBC: 0 % (ref 0.0–0.2)

## 2020-07-01 LAB — GLUCOSE, CAPILLARY
Glucose-Capillary: 137 mg/dL — ABNORMAL HIGH (ref 70–99)
Glucose-Capillary: 177 mg/dL — ABNORMAL HIGH (ref 70–99)
Glucose-Capillary: 97 mg/dL (ref 70–99)

## 2020-07-01 LAB — BASIC METABOLIC PANEL
Anion gap: 10 (ref 5–15)
BUN: 16 mg/dL (ref 8–23)
CO2: 26 mmol/L (ref 22–32)
Calcium: 8.4 mg/dL — ABNORMAL LOW (ref 8.9–10.3)
Chloride: 105 mmol/L (ref 98–111)
Creatinine, Ser: 0.88 mg/dL (ref 0.44–1.00)
GFR, Estimated: >60 mL/min (ref >60)
Glucose, Bld: 165 mg/dL — ABNORMAL HIGH (ref 70–99)
Potassium: 3.1 mmol/L — ABNORMAL LOW (ref 3.5–5.1)
Sodium: 141 mmol/L (ref 135–145)

## 2020-07-01 LAB — MAGNESIUM: Magnesium: 1.9 mg/dL (ref 1.7–2.4)

## 2020-07-01 MED ORDER — TICAGRELOR 90 MG PO TABS
90.0000 mg | ORAL_TABLET | Freq: Two times a day (BID) | ORAL | 0 refills | Status: DC
Start: 2020-07-01 — End: 2020-07-09

## 2020-07-01 MED ORDER — ASPIRIN 81 MG PO TABS: 81.0000 mg | ORAL_TABLET | Freq: Every day | ORAL | 0 refills | Status: AC

## 2020-07-01 MED ORDER — INSULIN ASPART 100 UNIT/ML ~~LOC~~ SOLN
0.0000 [IU] | Freq: Three times a day (TID) | SUBCUTANEOUS | Status: DC
  Administered 2020-07-01: 2 [IU] via SUBCUTANEOUS
  Filled 2020-07-01 (×2): qty 1

## 2020-07-01 MED ORDER — LOSARTAN POTASSIUM 25 MG PO TABS
25.0000 mg | ORAL_TABLET | Freq: Every day | ORAL | 0 refills | Status: DC
Start: 2020-07-02 — End: 2020-07-09

## 2020-07-01 MED ORDER — NITROGLYCERIN 0.4 MG SL SUBL: 0.4000 mg | SUBLINGUAL_TABLET | SUBLINGUAL | 0 refills | Status: AC | PRN

## 2020-07-01 MED ORDER — FUROSEMIDE 40 MG PO TABS: ORAL_TABLET | ORAL | 0 refills | Status: AC

## 2020-07-01 MED ORDER — POTASSIUM CHLORIDE CRYS ER 20 MEQ PO TBCR
40.0000 meq | EXTENDED_RELEASE_TABLET | Freq: Once | ORAL | Status: AC
  Administered 2020-07-01: 40 meq via ORAL
  Filled 2020-07-01: qty 2

## 2020-07-01 MED ORDER — ATORVASTATIN CALCIUM 80 MG PO TABS
80.0000 mg | ORAL_TABLET | Freq: Every day | ORAL | 0 refills | Status: DC
Start: 2020-07-02 — End: 2020-07-09

## 2020-07-01 MED ORDER — INSULIN ASPART 100 UNIT/ML ~~LOC~~ SOLN: 0.0000 [IU] | Freq: Every day | SUBCUTANEOUS | Status: DC

## 2020-07-01 NOTE — Progress Notes (Signed)
Mobility Specialist - Progress Note   07/01/20 1100  Mobility  Activity Ambulated in room  Level of Assistance Independent  Assistive Device None  Distance Ambulated (ft) 25 ft  Mobility Response Tolerated well  Mobility performed by Mobility specialist  $Mobility charge 1 Mobility   Pre-mobility: 87 HR, 97% SpO2 Post-mobility: 97 HR, 100% SpO2   Pt was lying in bed upon arrival with granddaughter present in room. Pt utilizing room air, agreed to session. Pt denied pain, nausea, or fatigue. Pt is independent in all transfers this date. Pt ambulated 25' in room with no heavy breathing noted. Pt denied SOB. No LOB. Pt denied weakness or dizziness during activity. Overall, pt tolerated session well. Pt was left in bed with needs in reach, anticipating d/c.    Stacy Newman Mobility Specialist 07/01/20, 11:28 AM

## 2020-07-01 NOTE — Progress Notes (Signed)
   Heart Failure Nurse Navigator Note   HFrEF 30-35%, had previously reported at 40-45%  Co morbidities:  Diabetes Hypertension Hyperlipidemia  She presented to the ED with complaints of mild to moderate tingling sensation of her left chest.  Also noted was nausea and diaphoresis.  She was taken to the catheterization lab where angiogram revealed stenosis of the LCX, she received a drug eluting stent.  Medications:   Asa 81 mg daily Lipitor 80 mg daily Coreg 6.25 mg BID Losartan 25 mg daily Brilinta 90 mg  BID  Labs:  Sodium 141, potassium 3.1, BUN 16, creatinine 0.88, magnesium 1.9, WBC 5.5, hemoglobin 11.1, platelets 308  Weight 81 Kg BMI 29.4 BP 100/60 Intake 120 ml Output 1550 ml   Assessment:  General- she is awake and alert, sitting in bed, granddaughter present.  HEENT-poor dentition, No JVD  Cardiac- heart tones regular  Chest breath sounds clear to posterior auscultation  Abdomen- soft non tender  Musculoskeletal- no lower extremity edema.  Psych  Is pleasant and appropriate.  Neuro-pupils equal, moves well, speech clear.   Discussed heart failure with patient and with her recent heart attack, the squeeze of her heart has declined.  Also talked about sodium and fluid restriction- what effect it has on the heart and why it is important.  She also view the heart failure video.  She states she has a scale and will start to weigh daily and record.  Discussed when to phone doctor.  She states that she does that now.  She will follow up in the heart failure clinic next week.   Pricilla Riffle RN, CHFN

## 2020-07-01 NOTE — Plan of Care (Signed)
Pt ready for discharge IV and tele removed Discharge instructions reviewed with patient and granddaughter Time allowed for questions and concerns Verbalizes an understanding on plan of care  Problem: Education: Goal: Knowledge of General Education information will improve Description: Including pain rating scale, medication(s)/side effects and non-pharmacologic comfort measures Outcome: Adequate for Discharge   Problem: Health Behavior/Discharge Planning: Goal: Ability to manage health-related needs will improve Outcome: Adequate for Discharge   Problem: Clinical Measurements: Goal: Ability to maintain clinical measurements within normal limits will improve Outcome: Adequate for Discharge Goal: Will remain free from infection Outcome: Adequate for Discharge Goal: Diagnostic test results will improve Outcome: Adequate for Discharge Goal: Respiratory complications will improve Outcome: Adequate for Discharge Goal: Cardiovascular complication will be avoided Outcome: Adequate for Discharge   Problem: Activity: Goal: Risk for activity intolerance will decrease Outcome: Adequate for Discharge   Problem: Nutrition: Goal: Adequate nutrition will be maintained Outcome: Adequate for Discharge   Problem: Coping: Goal: Level of anxiety will decrease Outcome: Adequate for Discharge   Problem: Elimination: Goal: Will not experience complications related to bowel motility Outcome: Adequate for Discharge Goal: Will not experience complications related to urinary retention Outcome: Adequate for Discharge   Problem: Pain Managment: Goal: General experience of comfort will improve Outcome: Adequate for Discharge   Problem: Safety: Goal: Ability to remain free from injury will improve Outcome: Adequate for Discharge   Problem: Skin Integrity: Goal: Risk for impaired skin integrity will decrease Outcome: Adequate for Discharge   Problem: Education: Goal: Ability to demonstrate  management of disease process will improve Outcome: Adequate for Discharge Goal: Ability to verbalize understanding of medication therapies will improve Outcome: Adequate for Discharge Goal: Individualized Educational Video(s) Outcome: Adequate for Discharge   Problem: Activity: Goal: Capacity to carry out activities will improve Outcome: Adequate for Discharge   Problem: Cardiac: Goal: Ability to achieve and maintain adequate cardiopulmonary perfusion will improve Outcome: Adequate for Discharge   Problem: Education: Goal: Ability to describe self-care measures that may prevent or decrease complications (Diabetes Survival Skills Education) will improve Outcome: Adequate for Discharge Goal: Individualized Educational Video(s) Outcome: Adequate for Discharge   Problem: Coping: Goal: Ability to adjust to condition or change in health will improve Outcome: Adequate for Discharge   Problem: Fluid Volume: Goal: Ability to maintain a balanced intake and output will improve Outcome: Adequate for Discharge   Problem: Health Behavior/Discharge Planning: Goal: Ability to identify and utilize available resources and services will improve Outcome: Adequate for Discharge Goal: Ability to manage health-related needs will improve Outcome: Adequate for Discharge   Problem: Metabolic: Goal: Ability to maintain appropriate glucose levels will improve Outcome: Adequate for Discharge   Problem: Nutritional: Goal: Maintenance of adequate nutrition will improve Outcome: Adequate for Discharge Goal: Progress toward achieving an optimal weight will improve Outcome: Adequate for Discharge   Problem: Skin Integrity: Goal: Risk for impaired skin integrity will decrease Outcome: Adequate for Discharge   Problem: Tissue Perfusion: Goal: Adequacy of tissue perfusion will improve Outcome: Adequate for Discharge   Problem: Education: Goal: Understanding of cardiac disease, CV risk reduction,  and recovery process will improve Outcome: Adequate for Discharge Goal: Understanding of medication regimen will improve Outcome: Adequate for Discharge Goal: Individualized Educational Video(s) Outcome: Adequate for Discharge   Problem: Activity: Goal: Ability to tolerate increased activity will improve Outcome: Adequate for Discharge   Problem: Cardiac: Goal: Ability to achieve and maintain adequate cardiopulmonary perfusion will improve Outcome: Adequate for Discharge Goal: Vascular access site(s) Level 0-1 will be  maintained Outcome: Adequate for Discharge   Problem: Health Behavior/Discharge Planning: Goal: Ability to safely manage health-related needs after discharge will improve Outcome: Adequate for Discharge   Problem: Education: Goal: Individualized Educational Video(s) Outcome: Adequate for Discharge   Problem: Activity: Goal: Ability to return to baseline activity level will improve Outcome: Adequate for Discharge   Problem: Cardiovascular: Goal: Ability to achieve and maintain adequate cardiovascular perfusion will improve Outcome: Adequate for Discharge Goal: Vascular access site(s) Level 0-1 will be maintained Outcome: Adequate for Discharge   Problem: Health Behavior/Discharge Planning: Goal: Ability to safely manage health-related needs after discharge will improve Outcome: Adequate for Discharge

## 2020-07-01 NOTE — Telephone Encounter (Signed)
TCM....  Patient is being discharged   They saw Fletcher Anon  They are scheduled to see Sharolyn Douglas on 11/24  They were seen for NSTEMI  They need to be seen within 2 weeks  Pt not placed on wait list   Please call

## 2020-07-01 NOTE — Discharge Instructions (Signed)
Heart Attack A heart attack occurs when blood and oxygen supply to the heart is cut off. A heart attack causes damage to the heart that cannot be fixed. A heart attack is also called a myocardial infarction, or MI. If you think you are having a heart attack, do not wait to see if the symptoms will go away. Get medical help right away. What are the causes? This condition may be caused by:  A fatty substance (plaque) in the blood vessels (arteries). This can block the flow of blood to the heart.  A blood clot in the blood vessels that go to the heart. The blood clot blocks blood flow.  Low blood pressure.  An abnormal heartbeat.  Some diseases, such as problems in red blood cells (anemia)orproblems in breathing (respiratory failure).  Tightening (spasm) of a blood vessel that cuts off blood to the heart.  A tear in a blood vessel of the heart.  High blood pressure. What increases the risk? The following factors may make you more likely to develop this condition:  Aging. The older you are, the higher your risk.  Having a personal or family history of chest pain, heart attack, stroke, or narrowing of the arteries in the legs, arms, head, or stomach (peripheral artery disease).  Being female.  Smoking.  Not getting regular exercise.  Being overweight or obese.  Having high blood pressure.  Having high cholesterol.  Having diabetes.  Drinking too much alcohol.  Using illegal drugs, such as cocaine or methamphetamine. What are the signs or symptoms? Symptoms of this condition include:  Chest pain. It may feel like: ? Crushing or squeezing. ? Tightness, pressure, fullness, or heaviness.  Pain in the arm, neck, jaw, back, or upper body.  Shortness of breath.  Heartburn.  Upset stomach (indigestion).  Feeling like you may vomit (nauseous).  Cold sweats.  Feeling tired.  Sudden light-headedness. How is this treated? A heart attack must be treated as soon as  possible. Treatment may include:  Medicines to: ? Break up or dissolve blood clots. ? Thin blood and help prevent blood clots. ? Treat blood pressure. ? Improve blood flow to the heart. ? Reduce pain. ? Reduce cholesterol.  Procedures to widen a blocked artery and keep it open.  Open heart surgery.  Receiving oxygen.  Making your heart strong again (cardiac rehabilitation) through exercise, education, and counseling. Follow these instructions at home: Medicines  Take over-the-counter and prescription medicines only as told by your doctor. You may need to take medicine: ? To keep your blood from clotting too easily. ? To control blood pressure. ? To lower cholesterol. ? To control heart rhythms.  Do not take these medicines unless your doctor says it is okay: ? NSAIDs, such as ibuprofen. ? Supplements that have vitamin A, vitamin E, or both. ? Hormone replacement therapy that has estrogen with or without progestin. Lifestyle      Do not use any products that have nicotine or tobacco, such as cigarettes, e-cigarettes, and chewing tobacco. If you need help quitting, ask your doctor.  Avoid secondhand smoke.  Exercise regularly. Ask your doctor about a cardiac rehab program.  Eat heart-healthy foods. Your doctor will tell you what foods to eat.  Stay at a healthy weight.  Lower your stress level.  Do not use illegal drugs. Alcohol use  Do not drink alcohol if: ? Your doctor tells you not to drink. ? You are pregnant, may be pregnant, or are planning to become pregnant.    If you drink alcohol: ? Limit how much you use to:  0-1 drink a day for women.  0-2 drinks a day for men. ? Know how much alcohol is in your drink. In the [place] one drink equals one 12 oz bottle of beer (355 mL), one 5 oz glass of wine (148 mL), or one 1 oz glass of hard liquor (44 mL). General instructions  Work with your doctor to treat other problems you may have, such as diabetes or high  blood pressure.  Get screened for depression. Get treatment if needed.  Keep your vaccines up to date. Get the flu shot (influenza vaccine) every year.  Keep all follow-up visits as told by your doctor. This is important. Contact a doctor if:  You feel very sad.  You have trouble doing your daily activities. Get help right away if:  You have sudden, unexplained discomfort in your chest, arms, back, neck, jaw, or upper body.  You have shortness of breath.  You have sudden sweating or clammy skin.  You feel like you may vomit.  You vomit.  You feel tired or weak.  You get light-headed or dizzy.  You feel your heart beating fast.  You feel your heart skipping beats.  You have blood pressure that is higher than 180/120. These symptoms may be an emergency. Do not wait to see if the symptoms will go away. Get medical help right away. Call your local emergency services (911 in the [place] Do not drive yourself to the hospital. Summary  A heart attack occurs when blood and oxygen supply to the heart is cut off.  Do not take NSAIDs unless your doctor says it is okay.  Do not smoke. Avoid secondhand smoke.  Exercise regularly. Ask your doctor about a cardiac rehab program. This information is not intended to replace advice given to you by your health care provider. Make sure you discuss any questions you have with your health care provider. Document Revised: 11/13/2018 Document Reviewed: 11/13/2018 Elsevier Patient Education  2020 Elsevier Inc.  

## 2020-07-01 NOTE — Discharge Summary (Signed)
[facility] [place] NAME: Stacy Newman    MR#:  [id number]  DATE OF BIRTH:  1945-10-06  DATE OF ADMISSION:  06/27/2020 ADMITTING PHYSICIAN: Toy Baker, MD  DATE OF DISCHARGE: 07/01/2020  2:28 PM  PRIMARY CARE PHYSICIAN: Lonn Georgia, FNP    ADMISSION DIAGNOSIS:  NSTEMI (non-ST elevated myocardial infarction) ([place]) [I21.4]  DISCHARGE DIAGNOSIS:  Active Problems:   Nonischemic cardiomyopathy (HCC)   Acute on chronic systolic CHF (congestive heart failure) (HCC)   Chronic combined systolic and diastolic congestive heart failure (HCC)   CAD in native artery   Essential hypertension   Hyperlipidemia   NSTEMI (non-ST elevated myocardial infarction) ([place])   Type 2 diabetes mellitus with hyperlipidemia (HCC)   Hypokalemia   Hypothyroidism   Iron deficiency anemia   SECONDARY DIAGNOSIS:   Past Medical History:  Diagnosis Date  . Cancer (HCC)    BREAST  . Diabetes mellitus without complication (HCC)    TYPE 11  . Hypercholesterolemia   . Hypertension   . Hypothyroidism     HOSPITAL COURSE:   1.  NSTEMI.  Patient had a cardiac catheterization done on 06/30/2020 and a stent was placed in the left circumflex artery.  The distal LAD was occluded and that will have to be treated medically.  Patient is on aspirin, Brilinta and atorvastatin.  Patient will continue on her Coreg and lisinopril changed to losartan.  Patient's troponin peaked at [place].  LDL 106.  Note for out of work until follows up with Dr. Saunders Revel cardiology as outpatient. 2.  Acute on chronic systolic congestive heart failure.  Continue Coreg and losartan.  Lasix will be as needed for weight gain or swelling.  Blood pressure too low for Entresto or spironolactone.  Can consider [facility] Glade as outpatient. 3.  Hypokalemia.  Replace potassium again today. 4.  Iron deficiency anemia.  Continue to trend hemoglobin especially as outpatient since on aspirin and Brilinta.   Hemoglobin up to 11.1 today but previously was 9. 5.  Type 2 diabetes mellitus with hyperlipidemia unspecified simvastatin switched over to  Atorvastatin.  Hemoglobin A1c low.  Can get rid of glipizide.  Can go back on Metformin once a day as outpatient.  Consider switching to [place] as outpatient. 6.  Hypothyroidism unspecified on levothyroxine   DISCHARGE CONDITIONS:   Satisfactory  CONSULTS OBTAINED:  Treatment Team:  Minna Merritts, MD  DRUG ALLERGIES:   Allergies  Allergen Reactions  . Food Color Red [Red Dye] Swelling    DISCHARGE MEDICATIONS:   Allergies as of 07/01/2020      Reactions   Food Color Red [red Dye] Swelling      Medication List    STOP taking these medications   glipiZIDE 5 MG tablet Commonly known as: GLUCOTROL   lisinopril 20 MG tablet Commonly known as: ZESTRIL   simvastatin 40 MG tablet Commonly known as: ZOCOR     TAKE these medications   aspirin 81 MG tablet Take 1 tablet (81 mg total) by mouth daily. LUNCH   atorvastatin 80 MG tablet Commonly known as: LIPITOR Take 1 tablet (80 mg total) by mouth daily. Start taking on: July 02, 2020   carvedilol 6.25 MG tablet Commonly known as: COREG Take 1 tablet (6.25 mg total) by mouth 2 (two) times daily.   furosemide 40 MG tablet Commonly known as: LASIX One tab po daily as needed for weight gain 3lbs in one day or 5 lbs in a week What  changed:   how much to take  how to take this  when to take this  additional instructions   levothyroxine 50 MCG tablet Commonly known as: SYNTHROID Take 50 mcg by mouth daily before breakfast.   losartan 25 MG tablet Commonly known as: COZAAR Take 1 tablet (25 mg total) by mouth daily. Start taking on: July 02, 2020   metFORMIN 500 MG tablet Commonly known as: GLUCOPHAGE Take 500 mg by mouth at bedtime.   nitroGLYCERIN 0.4 MG SL tablet Commonly known as: NITROSTAT Place 1 tablet (0.4 mg total) under the tongue every 5  (five) minutes x 3 doses as needed for chest pain.   ticagrelor 90 MG Tabs tablet Commonly known as: BRILINTA Take 1 tablet (90 mg total) by mouth 2 (two) times daily.        DISCHARGE INSTRUCTIONS:   Follow-up PMD 5 days Follow-up cardiology 1 week Follow-up cardiac rehab  If you experience worsening of your admission symptoms, develop shortness of breath, life threatening emergency, suicidal or homicidal thoughts you must seek medical attention immediately by calling 911 or calling your MD immediately  if symptoms less severe.  You Must read complete instructions/literature along with all the possible adverse reactions/side effects for all the Medicines you take and that have been prescribed to you. Take any new Medicines after you have completely understood and accept all the possible adverse reactions/side effects.   Please note  You were cared for by a hospitalist during your hospital stay. If you have any questions about your discharge medications or the care you received while you were in the hospital after you are discharged, you can call the unit and asked to speak with the hospitalist on call if the hospitalist that took care of you is not available. Once you are discharged, your primary care physician will handle any further medical issues. Please note that NO REFILLS for any discharge medications will be authorized once you are discharged, as it is imperative that you return to your primary care physician (or establish a relationship with a primary care physician if you do not have one) for your aftercare needs so that they can reassess your need for medications and monitor your lab values.    Today   CHIEF COMPLAINT:   Chief Complaint  Patient presents with  . Chest Pain  . Weakness    HISTORY OF PRESENT ILLNESS:  Stacy Newman  is a 74 y.o. female came in with chest pain and found to have NSTEMI   VITAL SIGNS:  Blood pressure 100/65, pulse 82, temperature  98.2 F (36.8 C), temperature source Oral, resp. rate 15, height 5\' 4"  (1.626 m), weight 77.7 kg, SpO2 100 %.  I/O:    Intake/Output Summary (Last 24 hours) at 07/01/2020 1535 Last data filed at 07/01/2020 1300 Gross per 24 hour  Intake 121 ml  Output 1550 ml  Net -1429 ml    PHYSICAL EXAMINATION:  GENERAL:  74 y.o.-year-old patient lying in the bed with no acute distress.  EYES: Pupils equal, round, reactive to light and accommodation. No scleral icterus. Extraocular muscles intact.  HEENT: Head atraumatic, normocephalic. Oropharynx and nasopharynx clear.  LUNGS: Normal breath sounds bilaterally, no wheezing, rales,rhonchi or crepitation. No use of accessory muscles of respiration.  CARDIOVASCULAR: S1, S2 normal. No murmurs, rubs, or gallops.  ABDOMEN: Soft, non-tender.  EXTREMITIES: No pedal edema.  NEUROLOGIC: Cranial nerves II through XII are intact. Muscle strength 5/5 in all extremities. Sensation intact. Gait not checked.  PSYCHIATRIC: The patient is alert and oriented x 3.  SKIN: No obvious rash, lesion, or ulcer.   DATA REVIEW:   CBC Recent Labs  Lab 07/01/20 0551  WBC 5.5  HGB 11.1*  HCT 32.4*  PLT 308    Chemistries  Recent Labs  Lab 06/27/20 2358 06/28/20 0722 07/01/20 0551  NA  --    < > 141  K  --    < > 3.1*  CL  --    < > 105  CO2  --    < > 26  GLUCOSE  --    < > 165*  BUN  --    < > 16  CREATININE  --    < > 0.88  CALCIUM  --    < > 8.4*  MG 1.6*   < > 1.9  AST 97*  --   --   ALT 42  --   --   ALKPHOS 78  --   --   BILITOT 0.9  --   --    < > = values in this interval not displayed.    Microbiology Results  Results for orders placed or performed during the hospital encounter of 06/27/20  Respiratory Panel by RT PCR (Flu A&B, Covid) - Nasopharyngeal Swab     Status: None   Collection Time: 06/27/20  8:52 PM   Specimen: Nasopharyngeal Swab  Result Value Ref Range Status   SARS Coronavirus 2 by RT PCR NEGATIVE NEGATIVE Final     Comment: (NOTE) SARS-CoV-2 target nucleic acids are NOT DETECTED.  The SARS-CoV-2 RNA is generally detectable in upper respiratoy specimens during the acute phase of infection. The lowest concentration of SARS-CoV-2 viral copies this assay can detect is 131 copies/mL. A negative result does not preclude SARS-Cov-2 infection and should not be used as the sole basis for treatment or other patient management decisions. A negative result may occur with  improper specimen collection/handling, submission of specimen other than nasopharyngeal swab, presence of viral mutation(s) within the areas targeted by this assay, and inadequate number of viral copies (<131 copies/mL). A negative result must be combined with clinical observations, patient history, and epidemiological information. The expected result is Negative.  Fact Sheet for Patients:  PinkCheek.be  Fact Sheet for Healthcare Providers:  GravelBags.it  This test is no t yet approved or cleared by the [place] FDA and  has been authorized for detection and/or diagnosis of SARS-CoV-2 by FDA under an Emergency Use Authorization (EUA). This EUA will remain  in effect (meaning this test can be used) for the duration of the COVID-19 declaration under Section 564(b)(1) of the Act, 21 U.S.C. section 360bbb-3(b)(1), unless the authorization is terminated or revoked sooner.     Influenza A by PCR NEGATIVE NEGATIVE Final   Influenza B by PCR NEGATIVE NEGATIVE Final    Comment: (NOTE) The Xpert Xpress SARS-CoV-2/FLU/RSV assay is intended as an aid in  the diagnosis of influenza from Nasopharyngeal swab specimens and  should not be used as a sole basis for treatment. Nasal washings and  aspirates are unacceptable for Xpert Xpress SARS-CoV-2/FLU/RSV  testing.  Fact Sheet for Patients: PinkCheek.be  Fact Sheet for Healthcare  Providers: GravelBags.it  This test is not yet approved or cleared by the [place] FDA and  has been authorized for detection and/or diagnosis of SARS-CoV-2 by  FDA under an Emergency Use Authorization (EUA). This EUA will remain  in effect (meaning this test can be used) for  the duration of the  Covid-19 declaration under Section 564(b)(1) of the Act, 21  U.S.C. section 360bbb-3(b)(1), unless the authorization is  terminated or revoked. Performed at [facility], [address] [place], [place] [address]     RADIOLOGY:  CARDIAC CATHETERIZATION  Result Date: 06/30/2020  Dist LAD lesion is 100% stenosed.  Mid LAD lesion is 30% stenosed.  Prox Cx lesion is 95% stenosed.  Post intervention, there is a 0% residual stenosis.  A drug-eluting stent was successfully placed using a STENT RESOLUTE ONYX 3.0X15.  Prox RCA lesion is 20% stenosed.  1.  Left dominant coronary arteries with severe two-vessel coronary artery disease.  Severe progression is noted since 2019 with an occluded apical LAD with collaterals as well as severe progression of calcified proximal left circumflex stenosis. 2.  Left ventricular angiography was not performed.  EF was moderately reduced by echo. 3.  Moderately elevated left ventricular end-diastolic pressure with an LVEDP of 28 mmHg. 4.  Successful [place] angioplasty and drug-eluting stent placement to the proximal left circumflex. Recommendations: The culprit for myocardial infarction might be the occluded apical LAD.  However, the vessel is small in diameter in that location and supplies small territory and already has some collaterals.  I elected to treat the severe proximal left circumflex disease.  Recommend dual antiplatelet therapy for at least 1 year. Monitor volume status closely and consider diuresing as needed. Recommend aggressive medical therapy for CAD and heart failure.  Consider switching to [facility].       Management plans discussed with the patient, and she is in agreement.  Family at the bedside the second time I came around.  CODE STATUS:     Code Status Orders  (From admission, onward)         Start     Ordered   06/27/20 2259  Full code  Continuous        06/27/20 2258        Code Status History    Date Active Date Inactive Code Status Order ID Comments User Context   02/28/2018 1018 02/28/2018 1540 Full Code [id number]  End, Harrell Gave, MD Inpatient   Advance Care Planning Activity      TOTAL TIME TAKING CARE OF THIS PATIENT: 35 minutes.    Loletha Grayer M.D on 07/01/2020 at 3:35 PM  Between 7am to 6pm - Pager - [phone]  After 6pm go to [email or web address] - password [email or web address]  [facility]  CC: Primary care physician; Lonn Georgia, [place]

## 2020-07-01 NOTE — Telephone Encounter (Signed)
-----   Message from Marykay Lex sent at 07/01/2020  3:58 PM EST ----- Regarding: RE: TOC She has been scheduled for 11/24 due to the holiday at the end of next week ----- Message ----- From: Nelva Bush, MD Sent: 07/01/2020   3:54 PM EST To: Arvil Chaco, PA-C, [facility], # Subject: TOC                                            Hello,Could you schedule a TOC appointment for Ms. Stacy Newman with me or an APP at the end of next week?  Please let me know if any questions or concerns arise.  Thanks.Gerald Stabs

## 2020-07-01 NOTE — Progress Notes (Signed)
Patient ID: [facility] [place] was admitted to the Hospital on 06/27/2020 and Discharged  07/01/2020 and should be excused from work/school   for 11 days starting 06/27/2020 , and will follow up with Dr End  Prior to getting clearance to going back to work.  Loletha Grayer M.D on 07/01/2020,at 8:18 AM  [facility] [place] [facility]

## 2020-07-01 NOTE — Care Management Note (Signed)
Patient given Brilinta coupon states will use at [employer].

## 2020-07-01 NOTE — Progress Notes (Signed)
Progress Note  Patient Name: Stacy Newman Date of Encounter: 07/01/2020  Primary Cardiologist: Nelva Bush, MD   Subjective   No chest pain or shortness of breath.  Denies tenderness to palpation at right radial arteriotomy site.  Reviewed medication changes.  She has a Brilinta coupon this morning.  Briefly discussed lifestyle changes, as well as taking the next 2 weeks off from work to allow for recovery.  Inpatient Medications    Scheduled Meds: . aspirin EC  81 mg Oral Daily  . atorvastatin  80 mg Oral Daily  . carvedilol  6.25 mg Oral BID  . enoxaparin (LOVENOX) injection  40 mg Subcutaneous Q24H  . influenza vaccine adjuvanted  0.5 mL Intramuscular Tomorrow-1000  . insulin aspart  0-5 Units Subcutaneous QHS  . insulin aspart  0-9 Units Subcutaneous TID WC  . levothyroxine  50 mcg Oral QAC breakfast  . loratadine  10 mg Oral Daily  . losartan  25 mg Oral Daily  . mouth rinse  15 mL Mouth Rinse BID  . pneumococcal 23 valent vaccine  0.5 mL Intramuscular Tomorrow-1000  . ticagrelor  90 mg Oral BID   Continuous Infusions:  PRN Meds: acetaminophen, ALPRAZolam, nitroGLYCERIN, ondansetron (ZOFRAN) IV, oxyCODONE   Vital Signs    Vitals:   06/30/20 1941 07/01/20 0255 07/01/20 0700 07/01/20 1200  BP: (!) 109/57 (!) 98/57 118/72 100/65  Pulse: 90 78 62 82  Resp: 18 19 15    Temp: 98.6 F (37 C) 97.6 F (36.4 C)  98.2 F (36.8 C)  TempSrc: Oral Oral Oral Oral  SpO2: 100% 100% 94% 100%  Weight:  77.7 kg    Height:        Intake/Output Summary (Last 24 hours) at 07/01/2020 1400 Last data filed at 07/01/2020 1300 Gross per 24 hour  Intake 121 ml  Output 1550 ml  Net -1429 ml   Last 3 Weights 07/01/2020 06/30/2020 06/29/2020  Weight (lbs) 171 lb 4.8 oz 170 lb 6.7 oz 171 lb 6.4 oz  Weight (kg) 77.7 kg 77.3 kg 77.747 kg      Telemetry    NSR-ST, short runs of SVT, short run idioventricular rhythm, rates 90s to 140s- Personally  Reviewed  ECG    NSR, 84 bpm, TWI in inferior leads and V3 to V6.  LAD.  Prolonged QTC 503 ms.- Personally Reviewed  Physical Exam   GEN: No acute distress.  Resting comfortably in bed. Neck: No JVD Cardiac: RRR, 1/6 systolic murmur.  No rubs, or gallops.  Right radial arteriotomy site without signs of swelling or infection.  Not TTP.  Dressing clean, dry, and intact. Respiratory:  CTAB. GI: Soft, nontender, non-distended  MS: No edema; No deformity. Neuro:  Nonfocal  Psych: Normal affect   Labs    High Sensitivity Troponin:   Recent Labs  Lab 06/27/20 1838 06/27/20 2052 06/27/20 2358 06/28/20 0722 06/30/20 0535  TROPONINIHS 2,529* 2,858* 5,685* 15,828* 5,470*      Chemistry Recent Labs  Lab 06/27/20 1838 06/27/20 2358 06/28/20 0722 06/30/20 0534 07/01/20 0551  NA   < >  --  136 138 141  K   < >  --  3.3* 3.4* 3.1*  CL   < >  --  101 105 105  CO2   < >  --  25 23 26   GLUCOSE   < >  --  133* 158* 165*  BUN   < >  --  19 17 16   CREATININE   < >  --  0.90 0.85 0.88  CALCIUM   < >  --  8.3* 8.4* 8.4*  PROT  --  6.5  --   --   --   ALBUMIN  --  3.2*  --   --   --   AST  --  97*  --   --   --   ALT  --  42  --   --   --   ALKPHOS  --  78  --   --   --   BILITOT  --  0.9  --   --   --   GFRNONAA   < >  --  >60 >60 >60  ANIONGAP   < >  --  10 10 10    < > = values in this interval not displayed.     Hematology Recent Labs  Lab 06/29/20 0433 06/30/20 0534 07/01/20 0551  WBC 5.4 5.5 5.5  RBC 3.41* 3.20* 3.90  HGB 9.7* 9.0* 11.1*  HCT 27.6* 26.7* 32.4*  MCV 80.9 83.4 83.1  MCH 28.4 28.1 28.5  MCHC 35.1 33.7 34.3  RDW 14.6 14.5 14.5  PLT 290 270 308    BNP Recent Labs  Lab 06/27/20 2358  BNP 1,194.3*     DDimer No results for input(s): DDIMER in the last 168 hours.   Radiology    CARDIAC CATHETERIZATION  Result Date: 06/30/2020  Dist LAD lesion is 100% stenosed.  Mid LAD lesion is 30% stenosed.  Prox Cx lesion is 95% stenosed.  Post  intervention, there is a 0% residual stenosis.  A drug-eluting stent was successfully placed using a STENT RESOLUTE ONYX 3.0X15.  Prox RCA lesion is 20% stenosed.  1.  Left dominant coronary arteries with severe two-vessel coronary artery disease.  Severe progression is noted since 2019 with an occluded apical LAD with collaterals as well as severe progression of calcified proximal left circumflex stenosis. 2.  Left ventricular angiography was not performed.  EF was moderately reduced by echo. 3.  Moderately elevated left ventricular end-diastolic pressure with an LVEDP of 28 mmHg. 4.  Successful Cutting Balloon angioplasty and drug-eluting stent placement to the proximal left circumflex. Recommendations: The culprit for myocardial infarction might be the occluded apical LAD.  However, the vessel is small in diameter in that location and supplies small territory and already has some collaterals.  I elected to treat the severe proximal left circumflex disease.  Recommend dual antiplatelet therapy for at least 1 year. Monitor volume status closely and consider diuresing as needed. Recommend aggressive medical therapy for CAD and heart failure.  Consider switching to [facility].   ECHOCARDIOGRAM COMPLETE  Result Date: 06/29/2020    ECHOCARDIOGRAM REPORT   Patient Name:   Stacy Newman Date of Exam: 06/29/2020 Medical Rec #:  [id number]                 Height:       64.0 in Accession #:    [id number]                Weight:       171.4 lb Date of Birth:  October 23, 1945                 BSA:          1.832 m Patient Age:    74 years                  BP:  120/80 mmHg Patient Gender: F                         HR:           80 bpm. Exam Location:  [facility] Procedure: 2D Echo, Cardiac Doppler, Color Doppler and Strain Analysis Indications:     NSTEMI; R07.89 Other chest pain; I20.0 Unstable angina; I42.0                  Dilated cardiomyopathy  History:         Patient has prior history of Echocardiogram  examinations.                  Cardiomegaly and Cardiomyopathy, NICM; Signs/Symptoms:Chest                  Pain.  Sonographer:     Arville Go Referring Phys:  [id number] Loletha Grayer Diagnosing Phys: Ida Rogue MD  Sonographer Comments: Global longitudinal strain was attempted. IMPRESSIONS  1. Left ventricular ejection fraction, by estimation, is 30 to 35%. The left ventricle has moderately decreased function. The left ventricle demonstrates global hypokinesis. The left ventricular internal cavity size was mildly dilated. Left ventricular diastolic parameters are consistent with Grade I diastolic dysfunction (impaired relaxation). The average left ventricular global longitudinal strain is -6.7 %. The global longitudinal strain is abnormal.  2. Right ventricular systolic function is normal. The right ventricular size is normal. There is mildly elevated pulmonary artery systolic pressure. The estimated right ventricular systolic pressure is 14.7 mmHg.  3. The mitral valve is normal in structure. Mild to moderate mitral valve regurgitation.  4. The inferior vena cava is dilated in size with <50% respiratory variability, suggesting right atrial pressure of 15 mmHg. FINDINGS  Left Ventricle: Left ventricular ejection fraction, by estimation, is 30 to 35%. The left ventricle has moderately decreased function. The left ventricle demonstrates global hypokinesis. The average left ventricular global longitudinal strain is -6.7 %.  The global longitudinal strain is abnormal. The left ventricular internal cavity size was mildly dilated. There is no left ventricular hypertrophy. Left ventricular diastolic parameters are consistent with Grade I diastolic dysfunction (impaired relaxation). Right Ventricle: The right ventricular size is normal. No increase in right ventricular wall thickness. Right ventricular systolic function is normal. There is mildly elevated pulmonary artery systolic pressure. The tricuspid  regurgitant velocity is 2.63  m/s, and with an assumed right atrial pressure of 15 mmHg, the estimated right ventricular systolic pressure is 82.9 mmHg. Left Atrium: Left atrial size was normal in size. Right Atrium: Right atrial size was normal in size. Pericardium: There is no evidence of pericardial effusion. Mitral Valve: The mitral valve is normal in structure. Mild to moderate mitral valve regurgitation. No evidence of mitral valve stenosis. Tricuspid Valve: The tricuspid valve is normal in structure. Tricuspid valve regurgitation is mild . No evidence of tricuspid stenosis. Aortic Valve: The aortic valve is normal in structure. Aortic valve regurgitation is not visualized. No aortic stenosis is present. Aortic valve peak gradient measures 9.7 mmHg. Pulmonic Valve: The pulmonic valve was normal in structure. Pulmonic valve regurgitation is not visualized. No evidence of pulmonic stenosis. Aorta: The aortic root is normal in size and structure. Venous: The inferior vena cava is dilated in size with less than 50% respiratory variability, suggesting right atrial pressure of 15 mmHg. IAS/Shunts: No atrial level shunt detected by color flow Doppler.  LEFT VENTRICLE PLAX 2D LVIDd:  4.74 cm     Diastology LVIDs:         3.94 cm     LV e' medial:    6.09 cm/s LV PW:         0.95 cm     LV E/e' medial:  15.7 LV IVS:        0.84 cm     LV e' lateral:   4.68 cm/s LVOT diam:     1.80 cm     LV E/e' lateral: 20.4 LV SV:         30 LV SV Index:   16          2D Longitudinal Strain LVOT Area:     2.54 cm    2D Strain GLS (A2C):   -6.7 %                            2D Strain GLS (A3C):   -3.3 %                            2D Strain GLS (A4C):   -10.3 % LV Volumes (MOD)           2D Strain GLS Avg:     -6.7 % LV vol d, MOD A2C: 90.2 ml LV vol d, MOD A4C: 76.0 ml LV vol s, MOD A2C: 59.4 ml LV vol s, MOD A4C: 46.3 ml 3D Volume EF: LV SV MOD A2C:     30.8 ml 3D EF:        40 % LV SV MOD A4C:     76.0 ml LV EDV:       161 ml  LV SV MOD BP:      30.2 ml LV ESV:       96 ml                            LV SV:        64 ml RIGHT VENTRICLE RV Basal diam:  2.45 cm RV S prime:     13.60 cm/s TAPSE (M-mode): 2.0 cm LEFT ATRIUM             Index       RIGHT ATRIUM           Index LA diam:        3.30 cm 1.80 cm/m  RA Area:     11.90 cm LA Vol (A2C):   29.1 ml 15.88 ml/m RA Volume:   26.90 ml  14.68 ml/m LA Vol (A4C):   34.8 ml 18.99 ml/m LA Biplane Vol: 34.7 ml 18.94 ml/m  AORTIC VALVE                PULMONIC VALVE AV Area (Vmax): 1.11 cm    PV Vmax:       0.95 m/s AV Vmax:        156.00 cm/s PV Peak grad:  3.6 mmHg AV Peak Grad:   9.7 mmHg LVOT Vmax:      67.90 cm/s LVOT Vmean:     46.300 cm/s LVOT VTI:       0.117 m  AORTA Ao Root diam: 2.20 cm Ao Asc diam:  2.50 cm MITRAL VALVE                TRICUSPID VALVE MV Area (PHT): 6.83 cm  TV Peak grad:   25.5 mmHg MV Decel Time: 111 msec     TV Vmax:        2.52 m/s MV E velocity: 95.50 cm/s   TR Peak grad:   27.7 mmHg MV A velocity: 111.00 cm/s  TR Vmax:        263.00 cm/s MV E/A ratio:  0.86                             SHUNTS                             Systemic VTI:  0.12 m                             Systemic Diam: 1.80 cm Ida Rogue MD Electronically signed by Ida Rogue MD Signature Date/Time: 06/29/2020/3:52:40 PM    Final     Cardiac Studies   [facility] 06/30/20  Dist LAD lesion is 100% stenosed.  Mid LAD lesion is 30% stenosed.  Prox Cx lesion is 95% stenosed.  Post intervention, there is a 0% residual stenosis.  A drug-eluting stent was successfully placed using a STENT RESOLUTE ONYX 3.0X15.  Prox RCA lesion is 20% stenosed. 1.  Left dominant coronary arteries with severe two-vessel coronary artery disease.  Severe progression is noted since 2019 with an occluded apical LAD with collaterals as well as severe progression of calcified proximal left circumflex stenosis. 2.  Left ventricular angiography was not performed.  EF was moderately reduced by echo. 3.   Moderately elevated left ventricular end-diastolic pressure with an LVEDP of 28 mmHg. 4.  Successful Cutting Balloon angioplasty and drug-eluting stent placement to the proximal left circumflex. Recommendations: The culprit for myocardial infarction might be the occluded apical LAD.  However, the vessel is small in diameter in that location and supplies small territory and already has some collaterals.  I elected to treat the severe proximal left circumflex disease.   Recommend dual antiplatelet therapy for at least 1 year. Monitor volume status closely and consider diuresing as needed. Recommend aggressive medical therapy for CAD and heart failure.  Consider switching to [facility].  Echo 06/30/20 1. Left ventricular ejection fraction, by estimation, is 30 to 35%. The  left ventricle has moderately decreased function. The left ventricle  demonstrates global hypokinesis. The left ventricular internal cavity size  was mildly dilated. Left ventricular  diastolic parameters are consistent with Grade I diastolic dysfunction  (impaired relaxation). The average left ventricular global longitudinal  strain is -6.7 %. The global longitudinal strain is abnormal.  2. Right ventricular systolic function is normal. The right ventricular  size is normal. There is mildly elevated pulmonary artery systolic  pressure. The estimated right ventricular systolic pressure is 59.5 mmHg.  3. The mitral valve is normal in structure. Mild to moderate mitral valve  regurgitation.  4. The inferior vena cava is dilated in size with <50% respiratory  variability, suggesting right atrial pressure of 15 mmHg.  Echo 12/25/19 1. Left ventricular ejection fraction, by estimation, is 40 to 45%. The  left ventricle has mild to moderately decreased function. The left  ventricle demonstrates global hypokinesis. Left ventricular diastolic  parameters are consistent with Grade I  diastolic dysfunction (impaired  relaxation).  2. Right ventricular systolic function is normal. The right ventricular  size is normal.  3. The  mitral valve is degenerative. Mild to moderate mitral valve  regurgitation. No evidence of mitral stenosis.  4. The aortic valve is tricuspid. Aortic valve regurgitation is not  visualized. Mild aortic valve sclerosis is present, with no evidence of  aortic valve stenosis.  5. The inferior vena cava is normal in size with greater than 50%  respiratory variability, suggesting right atrial pressure of 3 mmHg.   R/LHC 02/2018 Conclusions: 1. Mild to moderate, non-obstructive coronary artery disease with 20-30% mid LAD and proximal LCx stenoses, as well as 40% proximal D1 lesion. Findings are consistent with non-ischemic cardiomyopathy. 2. Mildly to moderately elevated left heart filling pressures. 3. Mildly elevated right heart filling pressures. 4. Moderate pulmonary hypertension. 5. Reduced Fick cardiac output/index. Recommendations: 1. Medical therapy and risk factor modification to prevent progression of non-obstructive CAD. 2. Discontinue HCTZ and start furosemide 40 mg daily. 3. BMP in the office in ~1 week; clinic follow-up in ~2 weeks Recommend Aspirin 81mg  daily for moderate CAD.  Patient Profile     74 y.o. female hx of HFrEF secondary to nonischemic cardiomyopathy, DM2, hypertension, hyperlipidemia, hypothyroidism, and remote left breast cancer s/p lumpectomy and chemoradiation, and who is being seen today for the evaluation of NSTEMI s/p PCI as above.   Assessment & Plan    NSTEMI CAD with PCI to pLCx 06/30/20 --No current CP.  --HS peaked 15,828 on 06/28/20. --EF 30-35%, reduced from previous 40 to 45%. --S/p LHC as above with PCI to LCx and apical LAD to be treated medically.  --Continue medical management with DAPT with Brilinta and ASA for at least 1 year, BB, statin, and PRN SL nitro. Escalation of GDMT as tolerated and as outlined directly below.  Follow-up in the office within 1 to 2 weeks.  HFrEF, NICM --No current shortness of breath. --Echo as above with pulmonary HTN and cath with elevated LVEDP and IV Lasix 40 mg administered x1 during cath with improvement in breathing status. --Continue PTA as needed Lasix for weight gain and shortness of breath of swelling at discharge and as discussed with patient today.   --Monitor volume status closely at RTC. --Given soft pressure, will defer escalation of GDMT with transition from [facility] to [facility] and addition of spironolactone to the outpatient setting.   --Reassess escalation of GDMT at follow-up.   NSVT Paroxysmal SVT/ Idioventricular rhythm --Seen on telemetry.   --SVT and idioventricular rhythm s/p PCI. --Recommend repeat BMET at RTC to ensure electrolytes at goal. --Continue Coreg 6.25mg  BID.   HTN  --BP controlled to soft. --Started on Losartan and can transition from [facility] to [facility] if tolerated by BP at RTC.  Addition of spironolactone at RTC as tolerated.  Continue Coreg.   HLD --LDL 106, total cholesterol 162.   --Not at goal of LDL below 70. --Recommend escalation of statin with high intensity atorvastatin 80mg  +/- Zetia with repeat lipid and liver function in 6 to 8 weeks as an outpatient.  Hypokalemia --Recommend repeat BMET at RTC. --As above, consider addition of spironolactone at RTC.  Prolonged QTC --Avoid QT prolonging medications. --Continue to monitor at outpatient follow-up.  Anemia --H&H stable.  DM2 --Per PCP.  For questions or updates, please contact [place] Please consult [email or web address] for contact info under        Signed, Arvil Chaco, PA-C  07/01/2020, 2:00 PM

## 2020-07-02 NOTE — Telephone Encounter (Signed)
Patient contacted regarding discharge from [facility] on 07/01/20.  Patient understands to follow up with provider Ignacia Bayley, NP on 07/09/20 at 10:05am at the [facility] office. Patient understands discharge instructions? yes Patient understands medications and regiment? yes Patient understands to bring all medications to this visit? yes  Patient has no questions or concerns at this time.

## 2020-07-07 ENCOUNTER — Encounter: Payer: Self-pay | Admitting: Family

## 2020-07-07 ENCOUNTER — Ambulatory Visit: Payer: BC Managed Care – PPO | Attending: Family | Admitting: Family

## 2020-07-07 ENCOUNTER — Other Ambulatory Visit: Payer: Self-pay

## 2020-07-07 VITALS — BP 115/73 | HR 92 | Resp 20 | Ht 64.0 in | Wt 165.5 lb

## 2020-07-07 DIAGNOSIS — I2510 Atherosclerotic heart disease of native coronary artery without angina pectoris: Secondary | ICD-10-CM | POA: Diagnosis not present

## 2020-07-07 DIAGNOSIS — E119 Type 2 diabetes mellitus without complications: Secondary | ICD-10-CM | POA: Insufficient documentation

## 2020-07-07 DIAGNOSIS — Z79899 Other long term (current) drug therapy: Secondary | ICD-10-CM | POA: Diagnosis not present

## 2020-07-07 DIAGNOSIS — I10 Essential (primary) hypertension: Secondary | ICD-10-CM

## 2020-07-07 DIAGNOSIS — Z7984 Long term (current) use of oral hypoglycemic drugs: Secondary | ICD-10-CM | POA: Insufficient documentation

## 2020-07-07 DIAGNOSIS — Z955 Presence of coronary angioplasty implant and graft: Secondary | ICD-10-CM | POA: Insufficient documentation

## 2020-07-07 DIAGNOSIS — I214 Non-ST elevation (NSTEMI) myocardial infarction: Secondary | ICD-10-CM | POA: Diagnosis not present

## 2020-07-07 DIAGNOSIS — Z7982 Long term (current) use of aspirin: Secondary | ICD-10-CM | POA: Insufficient documentation

## 2020-07-07 DIAGNOSIS — I110 Hypertensive heart disease with heart failure: Secondary | ICD-10-CM | POA: Insufficient documentation

## 2020-07-07 DIAGNOSIS — E7800 Pure hypercholesterolemia, unspecified: Secondary | ICD-10-CM | POA: Diagnosis not present

## 2020-07-07 DIAGNOSIS — I5022 Chronic systolic (congestive) heart failure: Secondary | ICD-10-CM

## 2020-07-07 DIAGNOSIS — E785 Hyperlipidemia, unspecified: Secondary | ICD-10-CM | POA: Diagnosis not present

## 2020-07-07 DIAGNOSIS — I509 Heart failure, unspecified: Secondary | ICD-10-CM | POA: Diagnosis not present

## 2020-07-07 DIAGNOSIS — E1169 Type 2 diabetes mellitus with other specified complication: Secondary | ICD-10-CM

## 2020-07-07 NOTE — Patient Instructions (Signed)
Continue weighing daily and call for an overnight weight gain of > 2 pounds or a weekly weight gain of >5 pounds. 

## 2020-07-07 NOTE — Progress Notes (Signed)
Patient ID: [name], female    DOB: May 06, 1946, 74 y.o.   MRN: [id number]  HPI  Stacy Newman is a 74 y/o female with a history of DM, hyperlipidemia, HTN, thyroid disease, breast cancer and chronic heart failure.   Echo report from 06/29/20 reviewed and showed an EF of 30-35% along with mild/moderate MR along with moderately elevated PA pressure of 42.7 mmHg.   LHC done 06/30/20 showed:  Dist LAD lesion is 100% stenosed.  Mid LAD lesion is 30% stenosed.  Prox Cx lesion is 95% stenosed.  Post intervention, there is a 0% residual stenosis.  A drug-eluting stent was successfully placed using a STENT RESOLUTE ONYX 3.0X15.  Prox RCA lesion is 20% stenosed.   1.  Left dominant coronary arteries with severe two-vessel coronary artery disease.  Severe progression is noted since 2019 with an occluded apical LAD with collaterals as well as severe progression of calcified proximal left circumflex stenosis. 2.  Left ventricular angiography was not performed.  EF was moderately reduced by echo. 3.  Moderately elevated left ventricular end-diastolic pressure with an LVEDP of 28 mmHg. 4.  Successful [place] angioplasty and drug-eluting stent placement to the proximal left circumflex.  Admitted 06/27/20 due to chest pain and shortness of breath. Cardiology consult obtained. Initially needed IV lasix with transition to oral diuretics. Cath done with stent placed. Discharged after 4 days.   Stacy Newman presents today for her initial visit with a chief complaint of minimal fatigue upon moderate exertion. Stacy Newman describes this as having been present for several weeks but has improved since recent admission. Stacy Newman has associated cough along with this. Stacy Newman denies any difficulty sleeping, abdominal distention, palpitations, pedal edema, chest pain, shortness of breath, dizziness or weight gain.   Says that Stacy Newman exercises 10 minutes twice daily. Has continued with her PCP on the [place]  where Stacy Newman lived because Stacy Newman really likes that provider [place]. 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   Past Medical History:  Diagnosis Date  . Cancer (HCC)    BREAST  . CHF (congestive heart failure) ([place])   . Diabetes mellitus without complication (HCC)    TYPE 11  . Hypercholesterolemia   . Hypertension   . Hypothyroidism    Past Surgical History:  Procedure Laterality Date  . BREAST SURGERY  2001   LUMPECTOMY LEFT SIDE  . CARDIAC CATHETERIZATION    . CATARACT EXTRACTION W/PHACO Right 03/31/2015   Procedure: CATARACT EXTRACTION PHACO AND INTRAOCULAR LENS PLACEMENT (IOC);  Surgeon: Ronnell Freshwater, MD;  Location: [facility];  Service: Ophthalmology;  Laterality: Right;  DIABETIC-ORAL MEDS  . CATARACT EXTRACTION W/PHACO Left 12/29/2015   Procedure: CATARACT EXTRACTION PHACO AND INTRAOCULAR LENS PLACEMENT (IOC);  Surgeon: Ronnell Freshwater, MD;  Location: [facility];  Service: Ophthalmology;  Laterality: Left;  DIABETIC - oral meds LEAVE PT AT 830 ARRIVAL TIME  . CORONARY STENT INTERVENTION N/A 06/30/2020   Procedure: CORONARY STENT INTERVENTION;  Surgeon: Wellington Hampshire, MD;  Location: [facility];  Service: Cardiovascular;  Laterality: N/A;  . LEFT HEART CATH AND CORONARY ANGIOGRAPHY N/A 06/30/2020   Procedure: LEFT HEART CATH AND CORONARY ANGIOGRAPHY;  Surgeon: Wellington Hampshire, MD;  Location: [facility];  Service: Cardiovascular;  Laterality: N/A;  . RIGHT/LEFT HEART CATH AND CORONARY ANGIOGRAPHY Bilateral 02/28/2018   Procedure: RIGHT/LEFT HEART CATH AND  CORONARY ANGIOGRAPHY;  Surgeon: Nelva Bush, MD;  Location: [facility];  Service: Cardiovascular;  Laterality: Bilateral;   Family History  Problem Relation Age of Onset  . Alzheimer's disease Mother   . Lung cancer Father    Social History   Tobacco Use  . Smoking status:  Never Smoker  . Smokeless tobacco: Never Used  Substance Use Topics  . Alcohol use: No   Allergies  Allergen Reactions  . Food Color Red [Red Dye] Swelling   Prior to Admission medications   Medication Sig Start Date End Date Taking? Authorizing Provider  aspirin 81 MG tablet Take 1 tablet (81 mg total) by mouth daily. LUNCH 07/01/20  Yes Wieting, Richard, MD  atorvastatin (LIPITOR) 80 MG tablet Take 1 tablet (80 mg total) by mouth daily. 07/02/20  Yes Wieting, Richard, MD  carvedilol (COREG) 6.25 MG tablet Take 1 tablet (6.25 mg total) by mouth 2 (two) times daily. 02/22/18  Yes End, Harrell Gave, MD  furosemide (LASIX) 40 MG tablet One tab po daily as needed for weight gain 3lbs in one day or 5 lbs in a week 07/01/20  Yes Wieting, Richard, MD  levothyroxine (SYNTHROID, LEVOTHROID) 50 MCG tablet Take 50 mcg by mouth daily before breakfast.   Yes [provider]  losartan (COZAAR) 25 MG tablet Take 1 tablet (25 mg total) by mouth daily. 07/02/20  Yes Wieting, Richard, MD  metFORMIN (GLUCOPHAGE) 500 MG tablet Take 500 mg by mouth at bedtime.    Yes [provider]  nitroGLYCERIN (NITROSTAT) 0.4 MG SL tablet Place 1 tablet (0.4 mg total) under the tongue every 5 (five) minutes x 3 doses as needed for chest pain. 07/01/20  Yes Loletha Grayer, MD  ticagrelor (BRILINTA) 90 MG TABS tablet Take 1 tablet (90 mg total) by mouth 2 (two) times daily. 07/01/20  Yes Loletha Grayer, MD    Review of Systems  Constitutional: Positive for fatigue (minimal). Negative for appetite change.  HENT: Negative for congestion, postnasal drip and sore throat.   Eyes: Negative.   Respiratory: Positive for cough. Negative for shortness of breath.   Cardiovascular: Negative for chest pain, palpitations and leg swelling.  Gastrointestinal: Negative for abdominal distention and abdominal pain.  Endocrine: Negative.   Genitourinary: Negative.   Musculoskeletal: Negative for back pain and neck pain.   Skin: Negative.   Allergic/Immunologic: Negative.   Neurological: Negative for dizziness and light-headedness.  Hematological: Negative for adenopathy. Does not bruise/bleed easily.  Psychiatric/Behavioral: Negative for dysphoric mood and sleep disturbance (sleeping on 1 pillow). The patient is not nervous/anxious.    Vitals:   07/07/20 1031  BP: 115/73  Pulse: 92  Resp: 20  SpO2: 100%  Weight: 165 lb 8 oz (75.1 kg)  Height: 5\' 4"  (1.626 m)   Wt Readings from Last 3 Encounters:  07/07/20 165 lb 8 oz (75.1 kg)  07/01/20 171 lb 4.8 oz (77.7 kg)  01/30/20 172 lb (78 kg)   Lab Results  Component Value Date   CREATININE 0.88 07/01/2020   CREATININE 0.85 06/30/2020   CREATININE 0.90 06/28/2020    Physical Exam Vitals and nursing note reviewed.  Constitutional:      Appearance: Normal appearance.  HENT:     Head: Normocephalic and atraumatic.  Cardiovascular:     Rate and Rhythm: Normal rate and regular rhythm.  Pulmonary:     Effort: Pulmonary effort is normal.     Breath sounds: Normal breath sounds. No wheezing or rales.  Abdominal:  General: Abdomen is flat. There is no distension.     Palpations: Abdomen is soft.  Musculoskeletal:        General: No tenderness.     Cervical back: Normal range of motion and neck supple.     Right lower leg: No edema.     Left lower leg: No edema.  Skin:    General: Skin is warm and dry.  Neurological:     General: No focal deficit present.     Mental Status: Stacy Newman is alert and oriented to person, place, and time.  Psychiatric:        Mood and Affect: Mood normal.        Behavior: Behavior normal.        Thought Content: Thought content normal.    Assessment & Plan:  1: Chronic heart failure with reduced ejection fraction- - NYHA class II  - euvolemic today - weighing daily; reminded to call for an overnight weight gain of > 2 pounds or a weekly weight gain of >5 pounds - not adding salt and tries to be mindful of sodium  content of foods; dietary information along with low sodium cookbook given to patient - discussed lifevest and use of it; brochure provided and Stacy Newman will talk with cardiology at her appointment later this week; should they decided to use it, advised patient that we could get it ordered - today's BP may not tolerate changing losartan to entresto - consider adding farxiga and titrating up carvedilol at future visits - saw cardiology (End) 01/30/20 - BNP 06/27/20 was 1194.3  2: HTN- - BP looks good today - saw PCP 03/10/20 - BMP 07/01/20 reviewed and showed sodium 141, potassium 3.1, creatinine 0.88 and GFR >60  3: DM- - A1c 06/27/20 was 6.2%  4: NSTEMI- - LCA stent placed - continues on aspirin, brilinta and atorvastatin   Medication bottles reviewed.  Return in 6 weeks or sooner for any questions/problems before then.

## 2020-07-09 ENCOUNTER — Ambulatory Visit: Payer: BC Managed Care – PPO | Admitting: Nurse Practitioner

## 2020-07-09 ENCOUNTER — Encounter: Payer: Self-pay | Admitting: Nurse Practitioner

## 2020-07-09 ENCOUNTER — Other Ambulatory Visit: Payer: Self-pay

## 2020-07-09 VITALS — BP 120/60 | HR 93 | Ht 64.0 in | Wt 164.5 lb

## 2020-07-09 DIAGNOSIS — I214 Non-ST elevation (NSTEMI) myocardial infarction: Secondary | ICD-10-CM | POA: Diagnosis not present

## 2020-07-09 DIAGNOSIS — I2510 Atherosclerotic heart disease of native coronary artery without angina pectoris: Secondary | ICD-10-CM

## 2020-07-09 DIAGNOSIS — I5020 Unspecified systolic (congestive) heart failure: Secondary | ICD-10-CM | POA: Diagnosis not present

## 2020-07-09 DIAGNOSIS — I428 Other cardiomyopathies: Secondary | ICD-10-CM

## 2020-07-09 DIAGNOSIS — I10 Essential (primary) hypertension: Secondary | ICD-10-CM

## 2020-07-09 DIAGNOSIS — E1169 Type 2 diabetes mellitus with other specified complication: Secondary | ICD-10-CM

## 2020-07-09 DIAGNOSIS — E785 Hyperlipidemia, unspecified: Secondary | ICD-10-CM

## 2020-07-09 MED ORDER — ATORVASTATIN CALCIUM 80 MG PO TABS: 80.0000 mg | ORAL_TABLET | Freq: Every day | ORAL | 3 refills | Status: AC

## 2020-07-09 MED ORDER — TICAGRELOR 90 MG PO TABS: 90.0000 mg | ORAL_TABLET | Freq: Two times a day (BID) | ORAL | 3 refills | Status: AC

## 2020-07-09 MED ORDER — CARVEDILOL 12.5 MG PO TABS: 12.5000 mg | ORAL_TABLET | Freq: Two times a day (BID) | ORAL | 3 refills | Status: AC

## 2020-07-09 MED ORDER — LOSARTAN POTASSIUM 25 MG PO TABS: 25.0000 mg | ORAL_TABLET | Freq: Every day | ORAL | 3 refills | Status: DC

## 2020-07-09 NOTE — Progress Notes (Addendum)
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  Past Medical History    Past Medical History:  Diagnosis Date  . Breast cancer ([facility])    BREAST  . CAD (coronary artery disease)    a. 02/2018 Cath: mod nonobs dzs; b. 06/2020 NSTEMI/PCI: LM nl, LAD 33m, 100d w/ L->L collats from mLAD to apical LAD, D1/2 min irregs, LCX 95p (3.0x15 Resolute Onyx DES), RCA 20p.  . Diabetes mellitus without complication ([place])    TYPE 11  . HFrEF (heart failure with reduced ejection fraction) ([place])    a. 02/2018 Echo: EF 35-40%; b. 12/2019 Echo: EF 40-45%; c. 06/2020 Echo: Ef 30-35%, glob HK, Gr1 DD, glob longitudinal strain -6.7%, PASP 42.31mmHg. Mild-mod MR.  Marland Kitchen Hypercholesterolemia   . Hypertension   . Hypothyroidism   . Mixed Ischemic & Nonischemic Cardiomyopathy    a. 02/2018 Echo: EF 35-40%; b. 12/2019 Echo: EF 40-45%; c. 06/2020 Echo: Ef 30-35%, glob HK.   Past Surgical History:  Procedure Laterality Date  . BREAST SURGERY  2001   LUMPECTOMY LEFT SIDE  . CARDIAC CATHETERIZATION    . CATARACT EXTRACTION W/PHACO Right 03/31/2015   Procedure: CATARACT EXTRACTION PHACO AND INTRAOCULAR LENS PLACEMENT (IOC);  Surgeon: Ronnell Freshwater, MD;  Location: [facility];  Service: Ophthalmology;  Laterality: Right;  DIABETIC-ORAL MEDS  . CATARACT EXTRACTION W/PHACO Left 12/29/2015   Procedure: CATARACT EXTRACTION PHACO AND INTRAOCULAR LENS PLACEMENT (IOC);  Surgeon: Ronnell Freshwater, MD;  Location: [facility];   Service: Ophthalmology;  Laterality: Left;  DIABETIC - oral meds LEAVE PT AT 830 ARRIVAL TIME  . CORONARY STENT INTERVENTION N/A 06/30/2020   Procedure: CORONARY STENT INTERVENTION;  Surgeon: Wellington Hampshire, MD;  Location: [facility];  Service: Cardiovascular;  Laterality: N/A;  . LEFT HEART CATH AND CORONARY ANGIOGRAPHY N/A 06/30/2020   Procedure: LEFT HEART CATH AND CORONARY ANGIOGRAPHY;  Surgeon: Wellington Hampshire, MD;  Location: [facility];  Service: Cardiovascular;  Laterality: N/A;  . RIGHT/LEFT HEART CATH AND CORONARY ANGIOGRAPHY Bilateral 02/28/2018   Procedure: RIGHT/LEFT HEART CATH AND CORONARY ANGIOGRAPHY;  Surgeon: Nelva Bush, MD;  Location: [facility];  Service: Cardiovascular;  Laterality: Bilateral;    Allergies  Allergies  Allergen Reactions  . Food Color Red [Red Dye] Swelling    History of Present Illness    74 year old female with the above past medical history including nonischemic cardiomyopathy dating back to July 2019, at which time diagnostic catheterization revealed moderate, nonobstructive disease.  EF was 35 to 40% by echo at that time and she has been medically managed since.  Follow-up echo in May of this year showed an EF of 40-45%.  Other history includes hypertension, hyperlipidemia, diabetes, hypothyroidism, and remote left breast cancer.  On November 12, she developed severe left-sided chest pain associated diaphoresis and nausea and presented to [facility] where she ruled in for non-STEMI, eventually peaking her troponin at 15,828.  ECG was not felt to meet STEMI criteria.  Our team was consulted November 13.  Echocardiogram was performed and showed an EF of 30-35%.  Diagnostic catheterization revealed an occluded apical LAD with left to left collaterals from the mid LAD.  A 95% proximal stenosis in the left circumflex was also noted and was successfully treated with a drug-eluting stent.  The apical LAD stenosis was felt likely to  be the culprit lesion however, with established collaterals and given the small nature of the apical LAD, medical therapy was recommended.  She was discharged home on aspirin, Brilinta, high potency statin therapy, beta-blocker, and ARB.  Since discharge, she has felt reasonably well from a cardiac standpoint.  She has not been having any chest pain or dyspnea.  Her weight has been stable at 163 to 164 pounds on her home scale.  She denies palpitations, PND, orthopnea, dizziness, syncope, edema, or early satiety.  She has had a dry cough and today developed nosebleed while in the supermarket.  She thinks both may be related to not using her humidifiers at home.  She notes that her blood sugars have been running a little bit higher since discharge in the setting of her glipizide being discontinued.  She was previously using 5 mg daily.  Recent A1c was 6.2 and per discharge summary, that was why it was discontinued.  She plans to resume and prefers to use this over switching to [place].  Home Medications    Prior to Admission medications   Medication Sig Start Date End Date Taking? Authorizing Provider  aspirin 81 MG tablet Take 1 tablet (81 mg total) by mouth daily. LUNCH 07/01/20   Loletha Grayer, MD  atorvastatin (LIPITOR) 80 MG tablet Take 1 tablet (80 mg total) by mouth daily. 07/02/20   Loletha Grayer, MD  carvedilol (COREG) 6.25 MG tablet Take 1 tablet (6.25 mg total) by mouth 2 (two) times daily. 02/22/18   End, Harrell Gave, MD  furosemide (LASIX) 40 MG tablet One tab po daily as needed for weight gain 3lbs in one day or 5 lbs in a week 07/01/20   Loletha Grayer, MD  levothyroxine (SYNTHROID, LEVOTHROID) 50 MCG tablet Take 50 mcg by mouth daily before breakfast.    [provider]  losartan (COZAAR) 25 MG tablet Take 1 tablet (25 mg total) by mouth daily. 07/02/20   Loletha Grayer, MD  metFORMIN (GLUCOPHAGE) 500 MG tablet Take 500 mg by mouth at bedtime.     [provider]  nitroGLYCERIN (NITROSTAT) 0.4 MG SL tablet Place 1 tablet (0.4 mg total) under the tongue every 5 (five) minutes x 3 doses as needed for chest pain. 07/01/20   Loletha Grayer, MD  ticagrelor (BRILINTA) 90 MG TABS tablet Take 1 tablet (90 mg total) by mouth 2 (two) times daily. 07/01/20   Loletha Grayer, MD    Review of Systems    Dry cough and mild nosebleed this morning.  She has not been having chest pain, dyspnea, palpitations, PND, orthopnea, dizziness, syncope, edema, or early satiety..  All other systems reviewed and are otherwise negative except as noted above.  Physical Exam    VS:  BP 120/60 (BP Location: Left Arm, Patient Position: Sitting, Cuff Size: Normal)   Pulse 93   Ht 5\' 4"  (1.626 m)   Wt 164 lb 8 oz (74.6 kg)   SpO2 99%   BMI 28.24 kg/m  , BMI Body mass index is 28.24 kg/m. GEN: Well nourished, well developed, in no acute distress. HEENT: normal. Neck: Supple, no JVD, carotid bruits, or masses. Cardiac: RRR, no murmurs, rubs, or gallops. No clubbing, cyanosis, edema.  Radials 2+,  PT 2+ and equal bilaterally.  Respiratory:  Respirations regular and unlabored, clear to auscultation bilaterally.  Right radial cath site without bleeding, bruit, or hematoma. GI: Soft, nontender, nondistended, BS + x 4. MS: no deformity or atrophy. Skin: warm and dry, no rash. Neuro:  Strength and sensation are intact. Psych: Normal affect.  Accessory Clinical Findings    ECG personally reviewed by me today -regular sinus rhythm, 93, frequent PVCs, left axis deviation, LVH with anterolateral T wave inversion- no acute changes.  Lab Results  Component Value Date   WBC 5.5 07/01/2020   HGB 11.1 (L) 07/01/2020   HCT 32.4 (L) 07/01/2020   MCV 83.1 07/01/2020   PLT 308 07/01/2020   Lab Results  Component Value Date   CREATININE 0.88 07/01/2020   BUN 16 07/01/2020   NA 141 07/01/2020   K 3.1 (L) 07/01/2020   CL 105 07/01/2020   CO2 26 07/01/2020   Lab Results    Component Value Date   ALT 42 06/27/2020   AST 97 (H) 06/27/2020   ALKPHOS 78 06/27/2020   BILITOT 0.9 06/27/2020   Lab Results  Component Value Date   CHOL 162 06/28/2020   HDL 46 06/28/2020   LDLCALC 106 (H) 06/28/2020   TRIG 48 06/28/2020   CHOLHDL 3.5 06/28/2020    Lab Results  Component Value Date   HGBA1C 6.2 (H) 06/27/2020    Assessment & Plan    1.  Non-STEMI, subsequent episode of care/CAD: Status post recent admission with chest pain and non-STEMI and finding of an occluded apical LAD with left to left collaterals and severe left circumflex disease.  The circumflex was successfully treated with a drug-eluting stent.  It was felt that the apical LAD stenosis was likely the culprit.  She has not been having any chest pain or dyspnea.  She remains on aspirin, high potency statin, beta-blocker, ARB, and Brilinta therapy.  She did have a mild nosebleed this morning which has improved with direct pressure to the left nare.  I recommended humidification of her air at home.  She notes that she does have a humidifier for every room but just has not turned on yet.  She is considering cardiac rehabilitation and plans to stay at work at least until January.  2.  Chronic heart failure with reduced EF/mixed ischemic and nonischemic cardiomyopathy: History of nonischemic cardiomyopathy with previous low EF of 35-40 in July 2019 with subsequent improvement of 40 to 45% in May 2021.  In the setting of non-STEMI, EF back down to 30-35% with global hypokinesis.  Euvolemic on examination today.  Recent start of ARB therapy.  She uses furosemide as needed.  I will titrate carvedilol to 12.5 mg twice daily in the setting of frequent PVCs today.  Plan for follow-up echo in approximately 3 months to reevaluate LV function and determine potential candidacy for ICD therapy.  We did briefly discuss utility of a LifeVest and she is not interested.  We also discussed initiation of [place] and she is not  interested.  As I am titrating carvedilol, will hold off on initiation of spironolactone or transition from losartan to Entresto at this time.  Follow-up basic metabolic panel today given recent start of losartan.  3.  Essential hypertension: Stable.  Titrating carvedilol in the setting of PVCs.  Otherwise remains on losartan and as needed Lasix.  4.  Hyperlipidemia: LDL of 106 November 2013.  She is now on Lipitor 80 mg daily and will need follow-up  lipids and LFTs in approximately 5 to 6 weeks.  5.  Type 2 diabetes mellitus: Patient has been on Metformin and glipizide 5 mg daily for some time.  Recent A1c was 6.2 while she was hospitalized and her glipizide was discontinued at discharge.  Sugars have since been trending in the 170s and she plans to resume glipizide 5 mg daily.  We discussed potentially switching glipizide over to [place] however, she is not interested at this time.  6.  Hypokalemia: Potassium 3.1 in November 16.  We will follow-up today.  7.  Epistaxis: Patient with mild nosebleed from the left nare this AM.  She notes that the air is very dry in her home and though she has humidifiers, she has not turned them on yet.  Bleeding easily controlled with manual pressure.  Recommend humidification.  Further advised that we would not be stopping aspirin and Brilinta given recent ACS and that if she has recurrence or worsening of epistaxis, she will likely need ENT evaluation.  8.  Disposition: Follow-up basic metabolic panel today.  She has follow-up scheduled with Dr. Saunders Revel on December 15.  She will need follow-up lipids and LFTs shortly after that visit.  Murray Hodgkins, NP 07/09/2020, 10:22 AM

## 2020-07-09 NOTE — Patient Instructions (Signed)
Medication Instructions:  Your physician has recommended you make the following change in your medication: We have increase your  Carvedilol 12.5mg  by mouth twice a day Your other cardiac medication have been refilled Your Glipizide was added back to your medication list   *If you need a refill on your cardiac medications before your next appointment, please call your pharmacy*   Lab Work: Your physician recommends that you had a metabolic panel drawn in clinic today  You have labs (blood work) drawn today and your tests are completely normal, you will receive your results only by: Marland Kitchen MyChart Message (if you have MyChart) OR . A paper copy in the mail If you have any lab test that is abnormal or we need to change your treatment, we will call you to review the results.   Testing/Procedures: None   Follow-Up: At [facility], you and your health needs are our priority.  As part of our continuing mission to provide you with exceptional heart care, we have created designated Provider Care Teams.  These Care Teams include your primary Cardiologist (physician) and Advanced Practice Providers (APPs -  Physician Assistants and Nurse Practitioners) who all work together to provide you with the care you need, when you need it.  We recommend signing up for the patient portal called "MyChart".  Sign up information is provided on this After Visit Summary.  MyChart is used to connect with patients for Virtual Visits (Telemedicine).  Patients are able to view lab/test results, encounter notes, upcoming appointments, etc.  Non-urgent messages can be sent to your provider as well.   To learn more about what you can do with MyChart, go to [email or web address].    Your next appointment:   As schedule on 07/30/2020   Other Instructions N/A

## 2020-07-10 LAB — BASIC METABOLIC PANEL
BUN/Creatinine Ratio: 22 (ref 12–28)
BUN: 23 mg/dL (ref 8–27)
CO2: 24 mmol/L (ref 20–29)
Calcium: 9.5 mg/dL (ref 8.7–10.3)
Chloride: 99 mmol/L (ref 96–106)
Creatinine, Ser: 1.04 mg/dL — ABNORMAL HIGH (ref 0.57–1.00)
GFR calc Af Amer: 62 mL/min/{1.73_m2} (ref >59)
GFR calc non Af Amer: 54 mL/min/{1.73_m2} — ABNORMAL LOW (ref >59)
Glucose: 158 mg/dL — ABNORMAL HIGH (ref 65–99)
Potassium: 3.9 mmol/L (ref 3.5–5.2)
Sodium: 140 mmol/L (ref 134–144)

## 2020-07-14 ENCOUNTER — Telehealth: Payer: Self-pay

## 2020-07-14 NOTE — Telephone Encounter (Signed)
-----   Message from Theora Gianotti, NP sent at 07/11/2020  8:21 AM EST ----- Renal fxn/lytes stable.

## 2020-07-14 NOTE — Telephone Encounter (Signed)
Pt was contacted by mobile, was able to review results of her recent labs,  questions or concerns were address and no additional concerns at this time. Will call back for any questions.

## 2020-07-15 ENCOUNTER — Telehealth: Payer: Self-pay | Admitting: Internal Medicine

## 2020-07-15 DIAGNOSIS — Z0279 Encounter for issue of other medical certificate: Secondary | ICD-10-CM

## 2020-07-15 NOTE — Telephone Encounter (Signed)
[employer] hospital indemnity form received, payment received and sent to billing.  Form placed in nurse box

## 2020-07-16 NOTE — Telephone Encounter (Signed)
Completed forms faxed to [employer], received confirmation Patient made aware forms have been completed Forms scanned into documents

## 2020-07-24 NOTE — Telephone Encounter (Signed)
Payment posted and receipt mailed to patient

## 2020-07-30 ENCOUNTER — Encounter: Payer: Self-pay | Admitting: *Deleted

## 2020-07-30 ENCOUNTER — Ambulatory Visit: Payer: BC Managed Care – PPO | Admitting: Internal Medicine

## 2020-07-30 ENCOUNTER — Other Ambulatory Visit: Payer: Self-pay

## 2020-07-30 ENCOUNTER — Encounter: Payer: Self-pay | Admitting: Internal Medicine

## 2020-07-30 VITALS — BP 110/60 | HR 84 | Ht 64.0 in | Wt 164.1 lb

## 2020-07-30 DIAGNOSIS — I2510 Atherosclerotic heart disease of native coronary artery without angina pectoris: Secondary | ICD-10-CM

## 2020-07-30 DIAGNOSIS — I428 Other cardiomyopathies: Secondary | ICD-10-CM

## 2020-07-30 DIAGNOSIS — I255 Ischemic cardiomyopathy: Secondary | ICD-10-CM | POA: Diagnosis not present

## 2020-07-30 DIAGNOSIS — I5022 Chronic systolic (congestive) heart failure: Secondary | ICD-10-CM | POA: Diagnosis not present

## 2020-07-30 DIAGNOSIS — E785 Hyperlipidemia, unspecified: Secondary | ICD-10-CM

## 2020-07-30 DIAGNOSIS — E1159 Type 2 diabetes mellitus with other circulatory complications: Secondary | ICD-10-CM

## 2020-07-30 DIAGNOSIS — I10 Essential (primary) hypertension: Secondary | ICD-10-CM

## 2020-07-30 MED ORDER — LOSARTAN POTASSIUM 50 MG PO TABS: 50.0000 mg | ORAL_TABLET | Freq: Every day | ORAL | 2 refills | Status: AC

## 2020-07-30 NOTE — Patient Instructions (Signed)
Medication Instructions:  Your physician has recommended you make the following change in your medication:  1- INCREASE Losartan to 50 mg by mouth once a day.  *If you need a refill on your cardiac medications before your next appointment, please call your pharmacy*  Lab Work: Your physician recommends that you return for lab work in: 2 weeks  ~ 08/13/20 for BMET.  - Please go to the [facility]. You will check in at the front desk to the right as you walk into the atrium. Valet Parking is offered if needed. - No appointment needed. You may go any day between 7 am and 6 pm.  If you have labs (blood work) drawn today and your tests are completely normal, you will receive your results only by: Marland Kitchen MyChart Message (if you have MyChart) OR . A paper copy in the mail If you have any lab test that is abnormal or we need to change your treatment, we will call you to review the results.   Testing/Procedures: Your physician has requested that you have an LIMITED echocardiogram IN MID-February PRIOR TO FOLLOW UP appointment. Echocardiography is a painless test that uses sound waves to create images of your heart. It provides your doctor with information about the size and shape of your heart and how well your heart's chambers and valves are working. This procedure takes approximately one hour. There are no restrictions for this procedure. There is a possibility that an IV may need to be started during your test to inject an image enhancing agent. This is done to obtain more optimal pictures of your heart. Therefore we ask that you do at least drink some water prior to coming in to hydrate your veins.   Follow-Up: At [facility], you and your health needs are our priority.  As part of our continuing mission to provide you with exceptional heart care, we have created designated Provider Care Teams.  These Care Teams include your primary Cardiologist (physician) and Advanced Practice Providers (APPs  -  Physician Assistants and Nurse Practitioners) who all work together to provide you with the care you need, when you need it.  We recommend signing up for the patient portal called "MyChart".  Sign up information is provided on this After Visit Summary.  MyChart is used to connect with patients for Virtual Visits (Telemedicine).  Patients are able to view lab/test results, encounter notes, upcoming appointments, etc.  Non-urgent messages can be sent to your provider as well.   To learn more about what you can do with MyChart, go to [email or web address].    Your next appointment:   2 month(s) with limited echo prior to   The format for your next appointment:   In Person  Provider:   You may see Nelva Bush, MD or one of the following Advanced Practice Providers on your designated Care Team:    Murray Hodgkins, NP  Christell Faith, PA-C  Marrianne Mood, PA-C  Cadence [place], [place]  Laurann Montana, NP

## 2020-07-30 NOTE — Progress Notes (Signed)
Follow-up Outpatient Visit Date: 07/30/2020  Primary Care Provider: Lonn Georgia, [place] [address]  Chief Complaint: Follow-up coronary artery disease and heart failure  HPI:  Ms. Kochanowski is a 74 y.o. 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  She was last seen in our office by Ignacia Bayley, NP in late November shortly after her NSTEMI and PCI to the LCx.  As she was doing well at her follow-up visit other than a nonproductive cough and mild epistaxis.  She was encouraged to use a humidifier at home.  Carvedilol was increased to 12.5 mg twice daily in the setting of frequent PVCs noted on EKG. she has been tolerating this dose escalation well.  Today, Ms. Breau reports that she has been doing fairly well.  She notes occasional shortness of breath when she first gets up in the morning, though this actually improves with activity.  She denies exertional dyspnea as well as chest pain, palpitations, lightheadedness, edema, and orthopnea.  Her weights have been stable.  She is currently taking glipizide and Metformin with fasting blood sugars around 120.  She hopes to return back to work next month.  --------------------------------------------------------------------------------------------------  Past Medical History:  Diagnosis Date  . Breast cancer ([place])    BREAST  . CAD (coronary artery disease)    a. 02/2018 Cath: mod nonobs dzs; b. 06/2020 NSTEMI/PCI: LM nl, LAD 77m, 100d w/ L->L collats from mLAD to apical LAD, D1/2 min irregs, LCX 95p (3.0x15 Resolute Onyx DES), RCA 20p.  . Diabetes mellitus without complication ([place])    TYPE 11  . HFrEF  (heart failure with reduced ejection fraction) ([place])    a. 02/2018 Echo: EF 35-40%; b. 12/2019 Echo: EF 40-45%; c. 06/2020 Echo: Ef 30-35%, glob HK, Gr1 DD, glob longitudinal strain -6.7%, PASP 42.29mmHg. Mild-mod MR.  Marland Kitchen Hypercholesterolemia   . Hypertension   . Hypothyroidism   . Mixed Ischemic & Nonischemic Cardiomyopathy    a. 02/2018 Echo: EF 35-40%; b. 12/2019 Echo: EF 40-45%; c. 06/2020 Echo: Ef 30-35%, glob HK.   Past Surgical History:  Procedure Laterality Date  . BREAST SURGERY  2001   LUMPECTOMY LEFT SIDE  . CARDIAC CATHETERIZATION    . CATARACT EXTRACTION W/PHACO Right 03/31/2015   Procedure: CATARACT EXTRACTION PHACO AND INTRAOCULAR LENS PLACEMENT (IOC);  Surgeon: Ronnell Freshwater, MD;  Location: [place];  Service: Ophthalmology;  Laterality: Right;  DIABETIC-ORAL MEDS  . CATARACT EXTRACTION W/PHACO Left 12/29/2015   Procedure: CATARACT EXTRACTION PHACO AND INTRAOCULAR LENS PLACEMENT (IOC);  Surgeon: Ronnell Freshwater, MD;  Location: [place];  Service: Ophthalmology;  Laterality: Left;  DIABETIC - oral meds LEAVE PT AT 830 ARRIVAL TIME  . CORONARY STENT INTERVENTION N/A 06/30/2020   Procedure: CORONARY STENT INTERVENTION;  Surgeon: Wellington Hampshire, MD;  Location: [place] [facility];  Service: Cardiovascular;  Laterality: N/A;  . LEFT HEART CATH AND CORONARY ANGIOGRAPHY N/A 06/30/2020   Procedure: LEFT HEART CATH AND CORONARY ANGIOGRAPHY;  Surgeon: Wellington Hampshire, MD;  Location: [place] [facility];  Service: Cardiovascular;  Laterality: N/A;  . RIGHT/LEFT HEART CATH AND CORONARY ANGIOGRAPHY Bilateral 02/28/2018   Procedure: RIGHT/LEFT HEART CATH AND CORONARY ANGIOGRAPHY;  Surgeon: Nelva Bush, MD;  Location:  [place] [facility];  Service: Cardiovascular;  Laterality: Bilateral;    Current Meds  Medication Sig  . aspirin 81 MG tablet Take 1 tablet (81 mg total) by mouth daily. LUNCH  . atorvastatin (LIPITOR) 80 MG tablet Take 1  tablet (80 mg total) by mouth daily.  . carvedilol (COREG) 12.5 MG tablet Take 1 tablet (12.5 mg total) by mouth 2 (two) times daily.  . furosemide (LASIX) 40 MG tablet One tab po daily as needed for weight gain 3lbs in one day or 5 lbs in a week  . glipiZIDE (GLUCOTROL) 5 MG tablet Take 5 mg by mouth daily before breakfast.  . levothyroxine (SYNTHROID, LEVOTHROID) 50 MCG tablet Take 50 mcg by mouth daily before breakfast.  . metFORMIN (GLUCOPHAGE) 500 MG tablet Take 500 mg by mouth at bedtime.   . nitroGLYCERIN (NITROSTAT) 0.4 MG SL tablet Place 1 tablet (0.4 mg total) under the tongue every 5 (five) minutes x 3 doses as needed for chest pain.  . ticagrelor (BRILINTA) 90 MG TABS tablet Take 1 tablet (90 mg total) by mouth 2 (two) times daily.  . [DISCONTINUED] losartan (COZAAR) 25 MG tablet Take 1 tablet (25 mg total) by mouth daily.    Allergies: Food color red [red dye]  Social History   Tobacco Use  . Smoking status: Never Smoker  . Smokeless tobacco: Never Used  Vaping Use  . Vaping Use: Never used  Substance Use Topics  . Alcohol use: No  . Drug use: Never    Family History  Problem Relation Age of Onset  . Alzheimer's disease Mother   . Lung cancer Father     Review of Systems: A 12-system review of systems was performed and was negative except as noted in the HPI.  --------------------------------------------------------------------------------------------------  Physical Exam: BP 110/60 (BP Location: Left Arm, Patient Position: Sitting, Cuff Size: Normal)   Pulse 84   Ht 5\' 4"  (1.626 m)   Wt 164 lb 2 oz (74.4 kg)   SpO2 95%   BMI 28.17 kg/m   General: NAD. Neck: No JVD or HJR. Lungs: Clear to auscultation bilaterally without wheezes or crackles. Heart: Regular rate and rhythm without murmurs, rubs, or gallops. Abdomen: Soft, nontender, nondistended. Extremities: No lower extremity edema.  EKG: Normal sinus rhythm with left axis deviation, anterolateral T  wave inversions, inferior T wave inversions, and mild QT prolongation.  Compared with prior tracing from 07/09/2020, PVCs are no longer present.  QTC has also shortened.  Lab Results  Component Value Date   WBC 5.5 07/01/2020   HGB 11.1 (L) 07/01/2020   HCT 32.4 (L) 07/01/2020   MCV 83.1 07/01/2020   PLT 308 07/01/2020    Lab Results  Component Value Date   NA 140 07/09/2020   K 3.9 07/09/2020   CL 99 07/09/2020   CO2 24 07/09/2020   BUN 23 07/09/2020   CREATININE 1.04 (H) 07/09/2020   GLUCOSE 158 (H) 07/09/2020   ALT 42 06/27/2020    Lab Results  Component Value Date   CHOL 162 06/28/2020   HDL 46 06/28/2020   LDLCALC 106 (H) 06/28/2020   TRIG 48 06/28/2020   CHOLHDL 3.5 06/28/2020    --------------------------------------------------------------------------------------------------  ASSESSMENT AND PLAN: Coronary artery disease: Ms. Nolton does not report any angina.  She has some shortness of breath when getting up, though interestingly this improves with activity.  We will continue her current medications for secondary prevention including 12 months of dual antiplatelet therapy from the time  of her PCI to the LCx last month.  We will also continue current dose of carvedilol as well as high intensity statin therapy for target LDL less than 70.  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 Fudala appears euvolemic with NYHA class II symptoms.  Her blood pressure today is lower normal though she reports home readings are typically a little bit higher (typically 120s over 70s).  We have discussed escalation of losartan versus transitioning to [place].  Ms. Worthley would prefer to escalate losartan, which I think is reasonable.  We will increase this to 50 mg daily.  Carvedilol 12.5 mg twice daily should be continued.  Addition of spironolactone and/or SGLT2 inhibitor should be considered in the future.  We will plan to repeat a limited echo shortly  before Ms. Dowty follows up in mid/late February.  If LVEF remains less than 35%, ICD placement for primary prevention will need to be considered.  Hyperlipidemia: Continue high intensity statin therapy for target LDL less than 70.  Follow-up lipid panel will need to be drawn in January or February.  Type 2 diabetes mellitus: Continue Metformin and glipizide for now, though I would advocate for addition of SGLT2 inhibitor in the setting of chronic HFrEF.  I will defer this to her PCP.  Follow-up: Return to clinic in 2 months.  Nelva Bush, MD 07/30/2020 8:55 PM

## 2020-08-14 ENCOUNTER — Other Ambulatory Visit
Admission: RE | Admit: 2020-08-14 | Discharge: 2020-08-14 | Disposition: A | Discharge status: Home / Self Care | Payer: BC Managed Care – PPO | Source: Ambulatory Visit | Attending: Internal Medicine | Admitting: Internal Medicine

## 2020-08-14 DIAGNOSIS — I5022 Chronic systolic (congestive) heart failure: Secondary | ICD-10-CM | POA: Diagnosis present

## 2020-08-14 DIAGNOSIS — I2510 Atherosclerotic heart disease of native coronary artery without angina pectoris: Secondary | ICD-10-CM | POA: Insufficient documentation

## 2020-08-14 DIAGNOSIS — I10 Essential (primary) hypertension: Secondary | ICD-10-CM | POA: Insufficient documentation

## 2020-08-14 DIAGNOSIS — I428 Other cardiomyopathies: Secondary | ICD-10-CM | POA: Diagnosis present

## 2020-08-14 DIAGNOSIS — I255 Ischemic cardiomyopathy: Secondary | ICD-10-CM | POA: Insufficient documentation

## 2020-08-14 LAB — BASIC METABOLIC PANEL
Anion gap: 13 (ref 5–15)
BUN: 38 mg/dL — ABNORMAL HIGH (ref 8–23)
CO2: 24 mmol/L (ref 22–32)
Calcium: 9.6 mg/dL (ref 8.9–10.3)
Chloride: 100 mmol/L (ref 98–111)
Creatinine, Ser: 0.99 mg/dL (ref 0.44–1.00)
GFR, Estimated: >60 mL/min (ref >60)
Glucose, Bld: 186 mg/dL — ABNORMAL HIGH (ref 70–99)
Potassium: 3.7 mmol/L (ref 3.5–5.1)
Sodium: 137 mmol/L (ref 135–145)

## 2020-08-24 NOTE — Progress Notes (Signed)
Patient ID: Stacy Stacy, female    DOB: 1945-10-03, 75 y.o.   MRN: [id number]  HPI  Stacy Stacy is a 75 y/o female with a history of DM, hyperlipidemia, HTN, thyroid disease, breast cancer and chronic heart failure.   Echo report from 06/29/20 reviewed and showed an EF of 30-35% along with mild/moderate MR along with moderately elevated PA pressure of 42.7 mmHg.   LHC done 06/30/20 showed:  Dist LAD lesion is 100% stenosed.  Mid LAD lesion is 30% stenosed.  Prox Cx lesion is 95% stenosed.  Post intervention, there is a 0% residual stenosis.  A drug-eluting stent was successfully placed using a STENT RESOLUTE ONYX 3.0X15.  Prox RCA lesion is 20% stenosed.   1.  Left dominant coronary arteries with severe two-vessel coronary artery disease.  Severe progression is noted since 2019 with an occluded apical LAD with collaterals as well as severe progression of calcified proximal left circumflex stenosis. 2.  Left ventricular angiography was not performed.  EF was moderately reduced by echo. 3.  Moderately elevated left ventricular end-diastolic pressure with an LVEDP of 28 mmHg. 4.  Successful [place] angioplasty and drug-eluting stent placement to the proximal left circumflex.  Admitted 06/27/20 due to chest pain and shortness of breath. Cardiology consult obtained. Initially needed IV lasix with transition to oral diuretics. Cath done with stent placed. Discharged after 4 days.   She presents today with a chief complaint of a follow-up visit. She says that she currently has not symptoms and specifically denies any difficulty sleeping, dizziness, abdominal distention, palpitations, pedal edema, chest pain, shortness of breath, cough, fatigue or weight gain. Says that she feels good since Dr. Saunders Revel increased her losartan.   Says that she exercises 10 minutes twice daily. Has continued with her PCP on the [place] where she lived because [place] really likes that  provider [place]. 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   Past Medical History:  Diagnosis Date  . Breast cancer ([place])    BREAST  . CAD (coronary artery disease)    a. 02/2018 Cath: mod nonobs dzs; b. 06/2020 NSTEMI/PCI: LM nl, LAD 51m, 100d w/ L->L collats from mLAD to apical LAD, D1/2 min irregs, LCX 95p (3.0x15 Resolute Onyx DES), RCA 20p.  . Diabetes mellitus without complication ([place])    TYPE 11  . HFrEF (heart failure with reduced ejection fraction) ([place])    a. 02/2018 Echo: EF 35-40%; b. 12/2019 Echo: EF 40-45%; c. 06/2020 Echo: Ef 30-35%, glob HK, Gr1 DD, glob longitudinal strain -6.7%, PASP 42.12mmHg. Mild-mod MR.  Marland Kitchen Hypercholesterolemia   . Hypertension   . Hypothyroidism   . Mixed Ischemic & Nonischemic Cardiomyopathy    a. 02/2018 Echo: EF 35-40%; b. 12/2019 Echo: EF 40-45%; c. 06/2020 Echo: Ef 30-35%, glob HK.   Past Surgical History:  Procedure Laterality Date  . BREAST SURGERY  2001   LUMPECTOMY LEFT SIDE  . CARDIAC CATHETERIZATION    . CATARACT EXTRACTION W/PHACO Right 03/31/2015   Procedure: CATARACT EXTRACTION PHACO AND INTRAOCULAR LENS PLACEMENT (IOC);  Surgeon: Ronnell Freshwater, MD;  Location: [place];  Service: Ophthalmology;  Laterality: Right;  DIABETIC-ORAL MEDS  . CATARACT EXTRACTION W/PHACO Left 12/29/2015   Procedure: CATARACT EXTRACTION PHACO AND INTRAOCULAR LENS PLACEMENT (IOC);  Surgeon: Ronnell Freshwater, MD;  Location: [place];  Service: Ophthalmology;  Laterality: Left;  DIABETIC - oral meds LEAVE PT AT 830 ARRIVAL TIME  . CORONARY STENT INTERVENTION N/A 06/30/2020   Procedure: CORONARY STENT INTERVENTION;  Surgeon: Wellington Hampshire, MD;  Location: [place] [facility];  Service: Cardiovascular;  Laterality: N/A;  . LEFT HEART CATH AND CORONARY ANGIOGRAPHY N/A 06/30/2020   Procedure: LEFT HEART CATH AND CORONARY ANGIOGRAPHY;  Surgeon:  Wellington Hampshire, MD;  Location: [place] [facility];  Service: Cardiovascular;  Laterality: N/A;  . RIGHT/LEFT HEART CATH AND CORONARY ANGIOGRAPHY Bilateral 02/28/2018   Procedure: RIGHT/LEFT HEART CATH AND CORONARY ANGIOGRAPHY;  Surgeon: Nelva Bush, MD;  Location: [place] [facility];  Service: Cardiovascular;  Laterality: Bilateral;   Family History  Problem Relation Age of Onset  . Alzheimer's disease Mother   . Lung cancer Father    Social History   Tobacco Use  . Smoking status: Never Smoker  . Smokeless tobacco: Never Used  Substance Use Topics  . Alcohol use: No   Allergies  Allergen Reactions  . Food Color Red [Red Dye] Swelling   Prior to Admission medications   Medication Sig Start Date End Date Taking? Authorizing Provider  aspirin 81 MG tablet Take 1 tablet (81 mg total) by mouth daily. LUNCH 07/01/20  Yes Wieting, Richard, MD  atorvastatin (LIPITOR) 80 MG tablet Take 1 tablet (80 mg total) by mouth daily. 07/09/20  Yes Theora Gianotti, NP  carvedilol (COREG) 12.5 MG tablet Take 1 tablet (12.5 mg total) by mouth 2 (two) times daily. 07/09/20 10/07/20 Yes Theora Gianotti, NP  furosemide (LASIX) 40 MG tablet One tab po daily as needed for weight gain 3lbs in one day or 5 lbs in a week 07/01/20  Yes Wieting, Richard, MD  glipiZIDE (GLUCOTROL) 5 MG tablet Take 5 mg by mouth daily before breakfast.   Yes [provider]  levothyroxine (SYNTHROID, LEVOTHROID) 50 MCG tablet Take 50 mcg by mouth daily before breakfast.   Yes [provider]  losartan (COZAAR) 50 MG tablet Take 1 tablet (50 mg total) by mouth daily. 07/30/20 10/28/20 Yes End, Harrell Gave, MD  metFORMIN (GLUCOPHAGE) 500 MG tablet Take 500 mg by mouth at bedtime.    Yes [provider]  nitroGLYCERIN (NITROSTAT) 0.4 MG SL tablet Place 1 tablet (0.4 mg total) under the tongue every 5 (five) minutes x 3 doses as needed for chest pain. 07/01/20  Yes Loletha Grayer,  MD  ticagrelor (BRILINTA) 90 MG TABS tablet Take 1 tablet (90 mg total) by mouth 2 (two) times daily. 07/09/20  Yes Theora Gianotti, NP    Review of Systems  Constitutional: Negative for appetite change and fatigue.  HENT: Negative for congestion, postnasal drip and sore throat.   Eyes: Negative.   Respiratory: Negative for cough and shortness of breath.   Cardiovascular: Negative for chest pain, palpitations and leg swelling.  Gastrointestinal: Negative for abdominal distention and abdominal pain.  Endocrine: Negative.   Genitourinary: Negative.   Musculoskeletal: Negative for back pain and neck pain.  Skin: Negative.   Allergic/Immunologic: Negative.   Neurological: Negative for dizziness and light-headedness.  Hematological: Negative for adenopathy. Does not bruise/bleed easily.  Psychiatric/Behavioral: Negative for dysphoric mood and sleep disturbance (sleeping on 1 pillow). The patient is not nervous/anxious.    Vitals:   08/25/20 1423  BP: (!) 120/57  Pulse: 88  Resp: 18  SpO2: 100%  Weight: 167 lb (75.8 kg)  Height: 5\' 4"  (1.626 m)   Wt Readings from Last 3 Encounters:  08/25/20 167  lb (75.8 kg)  07/30/20 164 lb 2 oz (74.4 kg)  07/09/20 164 lb 8 oz (74.6 kg)   Lab Results  Component Value Date   CREATININE 0.99 08/14/2020   CREATININE 1.04 (H) 07/09/2020   CREATININE 0.88 07/01/2020    Physical Exam Vitals and nursing note reviewed.  Constitutional:      Appearance: Normal appearance.  HENT:     Head: Normocephalic and atraumatic.  Cardiovascular:     Rate and Rhythm: Normal rate and regular rhythm.  Pulmonary:     Effort: Pulmonary effort is normal.     Breath sounds: Normal breath sounds. No wheezing or rales.  Abdominal:     General: Abdomen is flat. There is no distension.     Palpations: Abdomen is soft.  Musculoskeletal:        General: No tenderness.     Cervical back: Normal range of motion and neck supple.     Right lower leg: No  edema.     Left lower leg: No edema.  Skin:    General: Skin is warm and dry.  Neurological:     General: No focal deficit present.     Mental Status: She is alert and oriented to person, place, and time.  Psychiatric:        Mood and Affect: Mood normal.        Behavior: Behavior normal.        Thought Content: Thought content normal.    Assessment & Plan:  1: Chronic heart failure with reduced ejection fraction- - NYHA class I - euvolemic today - weighing daily; reminded to call for an overnight weight gain of > 2 pounds or a weekly weight gain of >5 pounds - weight stable from last visit here 6 weeks ago - not adding salt and tries to be mindful of sodium content of foods - saw cardiology (End) 07/30/20 & losartan was increased; has tolerated this without know side effects - BNP 06/27/20 was 1194.3  2: HTN- - BP looks good today - saw PCP 03/10/20 & returns 09/08/20 - BMP 08/14/20 reviewed and showed sodium 137, potassium 3.7, creatinine 0.99 and GFR >60  3: DM- - A1c 06/27/20 was 6.2% - glucose at home today was 115   Medication bottles reviewed.  Due to HF stability, will not make a return appointment for patient at this time. Advised patient that she could call back at anytime to schedule another appointment and she was comfortable with this plan.

## 2020-08-25 ENCOUNTER — Encounter: Payer: Self-pay | Admitting: Family

## 2020-08-25 ENCOUNTER — Ambulatory Visit: Payer: BC Managed Care – PPO | Attending: Family | Admitting: Family

## 2020-08-25 ENCOUNTER — Other Ambulatory Visit: Payer: Self-pay

## 2020-08-25 VITALS — BP 120/57 | HR 88 | Resp 18 | Ht 64.0 in | Wt 167.0 lb

## 2020-08-25 DIAGNOSIS — I5022 Chronic systolic (congestive) heart failure: Secondary | ICD-10-CM | POA: Diagnosis present

## 2020-08-25 DIAGNOSIS — E119 Type 2 diabetes mellitus without complications: Secondary | ICD-10-CM | POA: Diagnosis not present

## 2020-08-25 DIAGNOSIS — E785 Hyperlipidemia, unspecified: Secondary | ICD-10-CM | POA: Insufficient documentation

## 2020-08-25 DIAGNOSIS — Z7984 Long term (current) use of oral hypoglycemic drugs: Secondary | ICD-10-CM | POA: Diagnosis not present

## 2020-08-25 DIAGNOSIS — Z7901 Long term (current) use of anticoagulants: Secondary | ICD-10-CM | POA: Insufficient documentation

## 2020-08-25 DIAGNOSIS — E079 Disorder of thyroid, unspecified: Secondary | ICD-10-CM | POA: Diagnosis not present

## 2020-08-25 DIAGNOSIS — I2510 Atherosclerotic heart disease of native coronary artery without angina pectoris: Secondary | ICD-10-CM | POA: Diagnosis not present

## 2020-08-25 DIAGNOSIS — Z79899 Other long term (current) drug therapy: Secondary | ICD-10-CM | POA: Diagnosis not present

## 2020-08-25 DIAGNOSIS — I110 Hypertensive heart disease with heart failure: Secondary | ICD-10-CM | POA: Diagnosis not present

## 2020-08-25 DIAGNOSIS — Z7982 Long term (current) use of aspirin: Secondary | ICD-10-CM | POA: Diagnosis not present

## 2020-08-25 DIAGNOSIS — E1159 Type 2 diabetes mellitus with other circulatory complications: Secondary | ICD-10-CM

## 2020-08-25 DIAGNOSIS — I10 Essential (primary) hypertension: Secondary | ICD-10-CM

## 2020-08-25 NOTE — Patient Instructions (Addendum)
Continue weighing daily and call for an overnight weight gain of > 2 pounds or a weekly weight gain of >5 pounds.   Call us in the future if you'd like to schedule another appointment 

## 2020-10-02 ENCOUNTER — Other Ambulatory Visit: Payer: Self-pay

## 2020-10-02 ENCOUNTER — Ambulatory Visit (INDEPENDENT_AMBULATORY_CARE_PROVIDER_SITE_OTHER): Payer: BC Managed Care – PPO

## 2020-10-02 DIAGNOSIS — I428 Other cardiomyopathies: Secondary | ICD-10-CM | POA: Diagnosis not present

## 2020-10-02 DIAGNOSIS — I255 Ischemic cardiomyopathy: Secondary | ICD-10-CM | POA: Diagnosis not present

## 2020-10-02 DIAGNOSIS — I2510 Atherosclerotic heart disease of native coronary artery without angina pectoris: Secondary | ICD-10-CM | POA: Diagnosis not present

## 2020-10-02 DIAGNOSIS — I5022 Chronic systolic (congestive) heart failure: Secondary | ICD-10-CM

## 2020-10-02 LAB — ECHOCARDIOGRAM LIMITED
S' Lateral: 3.4 cm
Single Plane A4C EF: 56.2 %

## 2020-10-03 ENCOUNTER — Telehealth: Payer: Self-pay | Admitting: *Deleted

## 2020-10-03 NOTE — Telephone Encounter (Signed)
The patient has been notified of the result and verbalized understanding.  All questions (if any) were answered. Eliberto Ivory Alaze Garverick, RN 10/03/2020 12:30 PM

## 2020-10-03 NOTE — Telephone Encounter (Signed)
No answer. Left message to call back.   

## 2020-10-03 NOTE — Telephone Encounter (Signed)
-----   Message from Nelva Bush, MD sent at 10/03/2020  7:59 AM EST ----- Please let Stacy Newman know that her heart pumping function has improved considerably and is now in the low normal range (LVEF 50-55%, previously 30-35% in 06/2020).  I recommend that she continue her current medications and follow-up as previously arranged.

## 2020-10-03 NOTE — Telephone Encounter (Signed)
Returning call for lab results  Please advise

## 2020-10-08 ENCOUNTER — Encounter: Payer: Self-pay | Admitting: Internal Medicine

## 2020-10-08 ENCOUNTER — Other Ambulatory Visit: Payer: Self-pay

## 2020-10-08 ENCOUNTER — Ambulatory Visit: Payer: BC Managed Care – PPO | Admitting: Internal Medicine

## 2020-10-08 VITALS — BP 110/70 | HR 92 | Ht 64.0 in | Wt 163.0 lb

## 2020-10-08 DIAGNOSIS — E785 Hyperlipidemia, unspecified: Secondary | ICD-10-CM

## 2020-10-08 DIAGNOSIS — I2510 Atherosclerotic heart disease of native coronary artery without angina pectoris: Secondary | ICD-10-CM | POA: Diagnosis not present

## 2020-10-08 DIAGNOSIS — E1169 Type 2 diabetes mellitus with other specified complication: Secondary | ICD-10-CM | POA: Diagnosis not present

## 2020-10-08 DIAGNOSIS — I5022 Chronic systolic (congestive) heart failure: Secondary | ICD-10-CM | POA: Diagnosis not present

## 2020-10-08 NOTE — Patient Instructions (Signed)

## 2020-10-08 NOTE — Progress Notes (Signed)
Follow-up Outpatient Visit Date: 10/08/2020  Primary Care Provider: Lonn Georgia, [place] [address]  Chief Complaint: Follow-up CAD and cardiomyopathy  HPI:  Stacy Newman is a 75 y.o. 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  I last saw her in 07/2020, which time she was doing well following her hospitalization a month earlier with NSTEMI and acute HFrEF status post PCI to dominant LCx.  We agreed to increase losartan to 50 mg daily.  Follow-up echo last week showed improvement in LVEF to 50-55%.  Today, Ms. Munshi reports that she has been feeling fairly well.  She has occasional sporadic shortness of breath that does not limit her.  She remains very active at work.  She has not had any chest pain, shortness of breath, palpitations, lightheadedness, or edema.  She notes some bruising but otherwise no significant bleeding.  She remains compliant with her medications.  She has not needed any of her as needed furosemide since her last visit.  --------------------------------------------------------------------------------------------------  Past Medical History:  Diagnosis Date  . Breast cancer ([place])    BREAST  . CAD (coronary artery disease)    a. 02/2018 Cath: mod nonobs dzs; b. 06/2020 NSTEMI/PCI: LM nl, LAD 12m, 100d w/ L->L collats from mLAD to apical LAD, D1/2 min irregs, LCX 95p (3.0x15 Resolute Onyx DES), RCA 20p.  . Diabetes mellitus without complication ([place])    TYPE 11  . HFrEF (heart failure with reduced ejection fraction) ([place])    a. 02/2018 Echo: EF 35-40%; b. 12/2019 Echo: EF 40-45%; c. 06/2020 Echo: Ef 30-35%, glob HK, Gr1 DD, glob  longitudinal strain -6.7%, PASP 42.64mmHg. Mild-mod MR.  Marland Kitchen Hypercholesterolemia   . Hypertension   . Hypothyroidism   . Mixed Ischemic & Nonischemic Cardiomyopathy    a. 02/2018 Echo: EF 35-40%; b. 12/2019 Echo: EF 40-45%; c. 06/2020 Echo: Ef 30-35%, glob HK.   Past Surgical History:  Procedure Laterality Date  . BREAST SURGERY  2001   LUMPECTOMY LEFT SIDE  . CARDIAC CATHETERIZATION    . CATARACT EXTRACTION W/PHACO Right 03/31/2015   Procedure: CATARACT EXTRACTION PHACO AND INTRAOCULAR LENS PLACEMENT (IOC);  Surgeon: Ronnell Freshwater, MD;  Location: [facility];  Service: Ophthalmology;  Laterality: Right;  DIABETIC-ORAL MEDS  . CATARACT EXTRACTION W/PHACO Left 12/29/2015   Procedure: CATARACT EXTRACTION PHACO AND INTRAOCULAR LENS PLACEMENT (IOC);  Surgeon: Ronnell Freshwater, MD;  Location: [facility];  Service: Ophthalmology;  Laterality: Left;  DIABETIC - oral meds LEAVE PT AT 830 ARRIVAL TIME  . CORONARY STENT INTERVENTION N/A 06/30/2020   Procedure: CORONARY STENT INTERVENTION;  Surgeon: Wellington Hampshire, MD;  Location: [facility];  Service: Cardiovascular;  Laterality: N/A;  . LEFT HEART CATH AND CORONARY ANGIOGRAPHY N/A 06/30/2020   Procedure: LEFT HEART CATH AND CORONARY ANGIOGRAPHY;  Surgeon: Wellington Hampshire, MD;  Location: [facility];  Service: Cardiovascular;  Laterality: N/A;  . RIGHT/LEFT HEART CATH AND CORONARY ANGIOGRAPHY Bilateral 02/28/2018   Procedure: RIGHT/LEFT HEART CATH AND CORONARY ANGIOGRAPHY;  Surgeon: Nelva Bush, MD;  Location: [facility];  Service: Cardiovascular;  Laterality: Bilateral;    Current Meds  Medication Sig  . aspirin 81 MG tablet Take 1 tablet (81 mg total)  by mouth daily. LUNCH  . atorvastatin (LIPITOR) 80 MG tablet Take 1 tablet (80 mg total) by mouth daily.  . carvedilol (COREG) 12.5 MG tablet Take 1 tablet (12.5 mg total) by mouth 2 (two) times daily.  . furosemide (LASIX) 40 MG  tablet One tab po daily as needed for weight gain 3lbs in one day or 5 lbs in a week  . glipiZIDE (GLUCOTROL) 5 MG tablet Take 5 mg by mouth daily before breakfast.  . levothyroxine (SYNTHROID, LEVOTHROID) 50 MCG tablet Take 50 mcg by mouth daily before breakfast.  . losartan (COZAAR) 50 MG tablet Take 1 tablet (50 mg total) by mouth daily.  . metFORMIN (GLUCOPHAGE) 500 MG tablet Take 500 mg by mouth at bedtime.   . nitroGLYCERIN (NITROSTAT) 0.4 MG SL tablet Place 1 tablet (0.4 mg total) under the tongue every 5 (five) minutes x 3 doses as needed for chest pain.  . ticagrelor (BRILINTA) 90 MG TABS tablet Take 1 tablet (90 mg total) by mouth 2 (two) times daily.    Allergies: Food color red [red dye]  Social History   Tobacco Use  . Smoking status: Never Smoker  . Smokeless tobacco: Never Used  Vaping Use  . Vaping Use: Never used  Substance Use Topics  . Alcohol use: No  . Drug use: Never    Family History  Problem Relation Age of Onset  . Alzheimer's disease Mother   . Lung cancer Father     Review of Systems: A 12-system review of systems was performed and was negative except as noted in the HPI.  --------------------------------------------------------------------------------------------------  Physical Exam: BP 110/70 (BP Location: Left Arm, Patient Position: Sitting, Cuff Size: Normal)   Pulse 92   Ht 5\' 4"  (1.626 m)   Wt 163 lb (73.9 kg)   SpO2 98%   BMI 27.98 kg/m   General:  NAD. Neck: No JVD or HJR. Lungs: Clear to auscultation bilaterally without wheezes or crackles. Heart: Regular rate and rhythm without murmurs, rubs, or gallops. Abdomen: Soft, nontender, nondistended. Extremities: No lower extremity edema.  EKG: Normal sinus rhythm with sinus arrhythmia and PVCs, left axis deviation, and borderline LVH.  Compared with prior tracing from 08/01/2020, anterolateral and inferior T wave inversions are less pronounced.  PVC is now present.  Lab Results   Component Value Date   WBC 5.5 07/01/2020   HGB 11.1 (L) 07/01/2020   HCT 32.4 (L) 07/01/2020   MCV 83.1 07/01/2020   PLT 308 07/01/2020    Lab Results  Component Value Date   NA 137 08/14/2020   K 3.7 08/14/2020   CL 100 08/14/2020   CO2 24 08/14/2020   BUN 38 (H) 08/14/2020   CREATININE 0.99 08/14/2020   GLUCOSE 186 (H) 08/14/2020   ALT 42 06/27/2020    Lab Results  Component Value Date   CHOL 162 06/28/2020   HDL 46 06/28/2020   LDLCALC 106 (H) 06/28/2020   TRIG 48 06/28/2020   CHOLHDL 3.5 06/28/2020    --------------------------------------------------------------------------------------------------  ASSESSMENT AND PLAN: Coronary artery disease: No symptoms to suggest worsening coronary insufficiency following NSTEMI and PCI to the LCx in 06/2020.  We will plan to continue 12 months of DAPT.  We discussed transitioning from ticagrelor to clopidogrel given intermittent shortness of breath, though we have agreed to defer this change.  Continue statin therapy.  Chronic HFrEF with recovered ejection fraction: LVEF low normal on last check a week ago.  We will plan to continue with current GDMT  consisting of carvedilol and losartan.  Patient appears euvolemic on exam today with NYHA class II symptoms.  Hyperlipidemia associated with type 2 diabetes mellitus: Continue high intensity statin therapy.  Follow-up: Return to clinic in 6 months.  Nelva Bush, MD 10/08/2020 1:34 PM

## 2021-01-19 ENCOUNTER — Telehealth: Payer: Self-pay | Admitting: Internal Medicine

## 2021-01-19 NOTE — Telephone Encounter (Signed)
Pt c/o medication issue:  1. Name of Medication: non specific  2. How are you currently taking this medication (dosage and times per day)? Please advise  3. Are you having a reaction (difficulty breathing--STAT)? no  4. What is your medication issue? Patient calling to ask if Dr. Saunders Revel advises of any medication changes after recent admission to [facility].    Please call.

## 2021-01-19 NOTE — Telephone Encounter (Signed)
Pt was discharged from [facility] on 12/04/20 s/p ischemic stroke.  Pt last seen in clinic by Dr. Saunders Revel 10/08/20 with current f/u scheduled 03/25/21.   Pt will need 8/10 follow up appt moved up sooner d/t recent hospital discharge.  We should see pt in clinic to assess for any medication changes if pt not having any new or change in s/s.   Attempted to call pt to discuss. No answer. VM is full. Unable to leave vm.  Will forward to scheduling to have f/u moved sooner for hospital f/u.

## 2021-01-20 NOTE — Telephone Encounter (Signed)
Scheduled next available on 6/17 [facility].  Fyi patient states she is scheduled to go out of state next week .  Patient ok with scheduling appt and adding to waitlist but did not confirm she would be changing travel plans to make appt .   Patient aware we will call from waitlist .

## 2021-01-22 ENCOUNTER — Ambulatory Visit: Payer: BC Managed Care – PPO | Admitting: Physician Assistant

## 2021-01-27 ENCOUNTER — Ambulatory Visit: Payer: BC Managed Care – PPO | Admitting: Physician Assistant

## 2021-01-27 NOTE — Progress Notes (Deleted)
Office Visit    Patient Name: Shaelee Forni Date of Encounter: 01/27/2021  PCP:  Lonn Georgia, [facility]  Cardiologist:  Nelva Bush, MD  Advanced Practice Provider:  No care team member to display Electrophysiologist:  None   Chief Complaint    No chief complaint on file.   75 y.o. female hx of HFrEF secondary to nonischemic cardiomyopathy, DM2, hypertension, hyperlipidemia, hypothyroidism, and remote left breast cancer s/p lumpectomy and chemoradiation, and who is being seen today for the evaluation of NSTEMI   Past Medical History    Past Medical History:  Diagnosis Date   Breast cancer ([employer])    BREAST   CAD (coronary artery disease)    a. 02/2018 Cath: mod nonobs dzs; b. 06/2020 NSTEMI/PCI: LM nl, LAD 48m, 100d w/ L->L collats from mLAD to apical LAD, D1/2 min irregs, LCX 95p (3.0x15 Resolute Onyx DES), RCA 20p.   Diabetes mellitus without complication ([place])    TYPE 11   HFrEF (heart failure with reduced ejection fraction) ([place])    a. 02/2018 Echo: EF 35-40%; b. 12/2019 Echo: EF 40-45%; c. 06/2020 Echo: Ef 30-35%, glob HK, Gr1 DD, glob longitudinal strain -6.7%, PASP 42.75mmHg. Mild-mod MR.   Hypercholesterolemia    Hypertension    Hypothyroidism    Mixed Ischemic & Nonischemic Cardiomyopathy    a. 02/2018 Echo: EF 35-40%; b. 12/2019 Echo: EF 40-45%; c. 06/2020 Echo: Ef 30-35%, glob HK.   Past Surgical History:  Procedure Laterality Date   BREAST SURGERY  2001   LUMPECTOMY LEFT SIDE   CARDIAC CATHETERIZATION     CATARACT EXTRACTION W/PHACO Right 03/31/2015   Procedure: CATARACT EXTRACTION PHACO AND INTRAOCULAR LENS PLACEMENT ([place]);  Surgeon: Ronnell Freshwater, MD;  Location: [facility];  Service: Ophthalmology;  Laterality: Right;  DIABETIC-ORAL MEDS   CATARACT EXTRACTION W/PHACO Left 12/29/2015   Procedure: CATARACT EXTRACTION PHACO AND INTRAOCULAR LENS PLACEMENT (IOC);  Surgeon: Ronnell Freshwater, MD;  Location: [facility];  Service: Ophthalmology;  Laterality: Left;  DIABETIC - oral meds LEAVE PT AT 830 ARRIVAL TIME   CORONARY STENT INTERVENTION N/A 06/30/2020   Procedure: CORONARY STENT INTERVENTION;  Surgeon: Wellington Hampshire, MD;  Location: [facility];  Service: Cardiovascular;  Laterality: N/A;   LEFT HEART CATH AND CORONARY ANGIOGRAPHY N/A 06/30/2020   Procedure: LEFT HEART CATH AND CORONARY ANGIOGRAPHY;  Surgeon: Wellington Hampshire, MD;  Location: [facility];  Service: Cardiovascular;  Laterality: N/A;   RIGHT/LEFT HEART CATH AND CORONARY ANGIOGRAPHY Bilateral 02/28/2018   Procedure: RIGHT/LEFT HEART CATH AND CORONARY ANGIOGRAPHY;  Surgeon: Nelva Bush, MD;  Location: [facility];  Service: Cardiovascular;  Laterality: Bilateral;    Allergies  Allergies  Allergen Reactions   Food Color Red [Red Dye] Swelling    History of Present Illness    Chae Jahnay Lantier is a 75 y.o. female with PMH as above.   She has history of CAD s/p [facility] 02/28/2017 that showed nonobstructive CAD with LMCA normal, mLAD 20%s, large D1 with proximal 40% stenosis, pLCx 20 to 30%s, nondominant RCA without significant disease.    12/2019 echo showed EF 40 to 45%, global hypokinesis, G1 DD, mild to moderate MR.   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  She drinks a cup of coffee every other day.  She denies any alcohol, tobacco, or drug use.   Admitted after calling the office 06/27/2020 with weakness then left-sided  chest pain that was severe and associated with diaphoresis and nausea.  This chest pain persisted about an hour before self resolving.  At [facility], [place] [address]. BNP 1194.3. She underwent LHC with PCI to Lcx. Following admission, her losartan was increased. Follow-up echo EF 50-55%. Seen 10/08/20. Discussed was possible transition from ticagrelor to clopidogrel given intermittent SOB, deferred at that time. She  was euvolemic with NYHA class II sx.       Home Medications   Current Outpatient Medications  Medication Instructions   aspirin 81 mg, Oral, Daily, LUNCH   atorvastatin (LIPITOR) 80 mg, Oral, Daily   carvedilol (COREG) 12.5 mg, Oral, 2 times daily   furosemide (LASIX) 40 MG tablet One tab po daily as needed for weight gain 3lbs in one day or 5 lbs in a week   glipiZIDE (GLUCOTROL) 5 mg, Oral, Daily before breakfast   levothyroxine (SYNTHROID) 50 mcg, Oral, Daily before breakfast   losartan (COZAAR) 50 mg, Oral, Daily   metFORMIN (GLUCOPHAGE) 500 mg, Oral, Daily at bedtime   nitroGLYCERIN (NITROSTAT) 0.4 mg, Sublingual, Every 5 min x3 PRN   ticagrelor (BRILINTA) 90 mg, Oral, 2 times daily     Review of Systems     All other systems reviewed and are otherwise negative except as noted above.  Physical Exam    VS:  There were no vitals taken for this visit. , BMI There is no height or weight on file to calculate BMI. GEN: Well nourished, well developed, in no acute distress. HEENT: normal. Neck: Supple, no JVD, carotid bruits, or masses. Cardiac: RRR, no murmurs, rubs, or gallops. No clubbing, cyanosis, edema.  Radials/DP/PT 2+ and equal bilaterally.  Respiratory:  Respirations regular and unlabored, clear to auscultation bilaterally. GI: Soft, nontender, nondistended, BS + x 4. MS: no deformity or atrophy. Skin: warm and dry, no rash. Neuro:  Strength and sensation are intact. Psych: Normal affect.  Accessory Clinical Findings    ECG personally reviewed by me today - *** - no acute changes.  VITALS Reviewed today   Temp Readings from Last 3 Encounters:  07/01/20 98.2 F (36.8 C) (Oral)  02/28/18 97.9 F (36.6 C) (Oral)  12/29/15 97.2 F (36.2 C)   BP Readings from Last 3 Encounters:  10/08/20 110/70  08/25/20 (!) 120/57  07/30/20 110/60   Pulse Readings from Last 3 Encounters:  10/08/20 92  08/25/20 88  07/30/20 84    Wt Readings from Last 3 Encounters:   10/08/20 163 lb (73.9 kg)  08/25/20 167 lb (75.8 kg)  07/30/20 164 lb 2 oz (74.4 kg)     LABS  reviewed today   Lab Results  Component Value Date   WBC 5.5 07/01/2020   HGB 11.1 (L) 07/01/2020   HCT 32.4 (L) 07/01/2020   MCV 83.1 07/01/2020   PLT 308 07/01/2020   Lab Results  Component Value Date   CREATININE 0.99 08/14/2020   BUN 38 (H) 08/14/2020   NA 137 08/14/2020   K 3.7 08/14/2020   CL 100 08/14/2020   CO2 24 08/14/2020   Lab Results  Component Value Date   ALT 42 06/27/2020   AST 97 (H) 06/27/2020   ALKPHOS 78 06/27/2020   BILITOT 0.9 06/27/2020   Lab Results  Component Value Date   CHOL 162 06/28/2020   HDL 46 06/28/2020   LDLCALC 106 (H) 06/28/2020   TRIG 48 06/28/2020   CHOLHDL 3.5 06/28/2020    Lab Results  Component Value  Date   HGBA1C 6.2 (H) 06/27/2020   Lab Results  Component Value Date   TSH 2.481 06/27/2020     STUDIES/PROCEDURES reviewed today    Echo 09/2020  1. Left ventricular ejection fraction, by estimation, is 50 to 55%. The  left ventricle has low normal function. The left ventricle has no regional  wall motion abnormalities. Left ventricular diastolic parameters are  indeterminate.   2. Right ventricular systolic function is normal. The right ventricular  size is normal. There is normal pulmonary artery systolic pressure. The  estimated right ventricular systolic pressure is 28.3 mmHg.   LHC 06/30/20 Dist LAD lesion is 100% stenosed. Mid LAD lesion is 30% stenosed. Prox Cx lesion is 95% stenosed. Post intervention, there is a 0% residual stenosis. A drug-eluting stent was successfully placed using a STENT RESOLUTE ONYX 3.0X15. Prox RCA lesion is 20% stenosed. 1.  Left dominant coronary arteries with severe two-vessel coronary artery disease.  Severe progression is noted since 2019 with an occluded apical LAD with collaterals as well as severe progression of calcified proximal left circumflex stenosis. 2.  Left ventricular  angiography was not performed.  EF was moderately reduced by echo. 3.  Moderately elevated left ventricular end-diastolic pressure with an LVEDP of 28 mmHg. 4.  Successful [place] angioplasty and drug-eluting stent placement to the proximal left circumflex. Recommendations: The culprit for myocardial infarction might be the occluded apical LAD.  However, the vessel is small in diameter in that location and supplies small territory and already has some collaterals.  I elected to treat the severe proximal left circumflex disease.   Recommend dual antiplatelet therapy for at least 1 year. Monitor volume status closely and consider diuresing as needed. Recommend aggressive medical therapy for CAD and heart failure.  Consider switching to [facility].   Echo 06/30/20  1. Left ventricular ejection fraction, by estimation, is 30 to 35%. The  left ventricle has moderately decreased function. The left ventricle  demonstrates global hypokinesis. The left ventricular internal cavity size  was mildly dilated. Left ventricular  diastolic parameters are consistent with Grade I diastolic dysfunction  (impaired relaxation). The average left ventricular global longitudinal  strain is -6.7 %. The global longitudinal strain is abnormal.   2. Right ventricular systolic function is normal. The right ventricular  size is normal. There is mildly elevated pulmonary artery systolic  pressure. The estimated right ventricular systolic pressure is 15.1 mmHg.   3. The mitral valve is normal in structure. Mild to moderate mitral valve  regurgitation.   4. The inferior vena cava is dilated in size with <50% respiratory  variability, suggesting right atrial pressure of 15 mmHg.   Echo 12/25/19  1. Left ventricular ejection fraction, by estimation, is 40 to 45%. The  left ventricle has mild to moderately decreased function. The left  ventricle demonstrates global hypokinesis. Left ventricular diastolic  parameters  are consistent with Grade I  diastolic dysfunction (impaired relaxation).   2. Right ventricular systolic function is normal. The right ventricular  size is normal.   3. The mitral valve is degenerative. Mild to moderate mitral valve  regurgitation. No evidence of mitral stenosis.   4. The aortic valve is tricuspid. Aortic valve regurgitation is not  visualized. Mild aortic valve sclerosis is present, with no evidence of  aortic valve stenosis.   5. The inferior vena cava is normal in size with greater than 50%  respiratory variability, suggesting right atrial pressure of 3 mmHg.   [facility] 02/2018 Conclusions:  Mild to moderate, non-obstructive coronary artery disease with 20-30% mid LAD and proximal LCx stenoses, as well as 40% proximal D1 lesion.  Findings are consistent with non-ischemic cardiomyopathy. Mildly to moderately elevated left heart filling pressures. Mildly elevated right heart filling pressures. Moderate pulmonary hypertension. Reduced Fick cardiac output/index. Recommendations: Medical therapy and risk factor modification to prevent progression of non-obstructive CAD. Discontinue HCTZ and start furosemide 40 mg daily. BMP in the office in ~1 week; clinic follow-up in ~2 weeks Recommend Aspirin 81mg  daily for moderate CAD.   Assessment & Plan      NSTEMI CAD with PCI to pLCx 06/30/20 --No current CP. --HS peaked 15,828 on 06/28/20. --EF 30-35%, reduced from previous 40 to 45%. --S/p LHC as above with PCI to LCx and apical LAD to be treated medically. --Continue medical management with DAPT with Brilinta and ASA for at least 1 year, BB, statin, and PRN SL nitro. Escalation of GDMT as tolerated and as outlined directly below. Follow-up in the office within 1 to 2 weeks.   HFrEF, NICM --No current shortness of breath. --Echo as above with pulmonary HTN and cath with elevated LVEDP and IV Lasix 40 mg administered x1 during cath with improvement in breathing  status. --Continue PTA as needed Lasix for weight gain and shortness of breath of swelling at discharge and as discussed with patient today.   --Monitor volume status closely at RTC. --Given soft pressure, will defer escalation of GDMT with transition from [facility]B to [facility] and addition of spironolactone to the outpatient setting.   --Reassess escalation of GDMT at follow-up.    NSVT Paroxysmal SVT/ Idioventricular rhythm --Seen on telemetry.   --SVT and idioventricular rhythm s/p PCI. --Recommend repeat BMET at RTC to ensure electrolytes at goal. --Continue Coreg 6.25mg  BID.   HTN --BP controlled to soft. --Started on Losartan and can transition from ARB to [facility] if tolerated by BP at RTC.  Addition of spironolactone at RTC as tolerated.  Continue Coreg.   HLD --LDL 106, total cholesterol 162.   --Not at goal of LDL below 70. --Recommend escalation of statin with high intensity atorvastatin 80mg  +/- Zetia with repeat lipid and liver function in 6 to 8 weeks as an outpatient.   Hypokalemia --Recommend repeat BMET at RTC. --As above, consider addition of spironolactone at RTC.   Prolonged QTC --Avoid QT prolonging medications. --Continue to monitor at outpatient follow-up.   Anemia --H&H stable.   DM2 --Per PCP.  Medication changes:  Labs ordered:  Studies / Imaging ordered:  Future considerations: *** Disposition:   *Please be aware that the above documentation was completed voice recognition software and may contain dictation errors.    Total time spent with patient today *** minutes. This includes reviewing records, evaluating the patient, and coordinating care. Face-to-face time >50%.    Arvil Chaco, PA-C 01/27/2021

## 2021-01-28 ENCOUNTER — Encounter: Payer: Self-pay | Admitting: Physician Assistant

## 2021-01-30 ENCOUNTER — Ambulatory Visit: Payer: BC Managed Care – PPO | Admitting: Physician Assistant

## 2021-02-09 ENCOUNTER — Telehealth: Payer: Self-pay | Admitting: Internal Medicine

## 2021-02-09 NOTE — Telephone Encounter (Signed)
Pt c/o BP issue: STAT if pt c/o blurred vision, one-sided weakness or slurred speech  1. What are your last 5 BP readings? 87/67, 125/67,   2. Are you having any other symptoms (ex. Dizziness, headache, blurred vision, passed out)? none  3. What is your BP issue? No energy

## 2021-02-10 NOTE — Telephone Encounter (Signed)
Attempted to call pt. No answer. Lmtcb.   See also phone note 01/19/21.  Pt had no show 01/27/21 and cancelled 01/30/21 appt that was made for hospital f/u s/p CVA and to discuss medication management.   Marrianne Mood, PA has opening tomorrow 6/29 @ 3PM will discuss with pt on return call.

## 2021-02-13 NOTE — Telephone Encounter (Signed)
Attempted to call the patient. No answer- I left a message to please call back.  

## 2021-02-27 ENCOUNTER — Encounter: Payer: Self-pay | Admitting: Internal Medicine

## 2021-03-25 ENCOUNTER — Ambulatory Visit: Payer: BC Managed Care – PPO | Admitting: Internal Medicine

## 2021-05-06 IMAGING — CR DG CHEST 2V
1 series · 2 of 2 positions shown · non-contrast
Comparison: None.

CLINICAL DATA: 74-year-old female with chest pain.

EXAM:
CHEST - 2 VIEW

[Series 1: dg chest 2 view · 0.14mm/px · 2 of 2 slices shown]
[im 1/2]
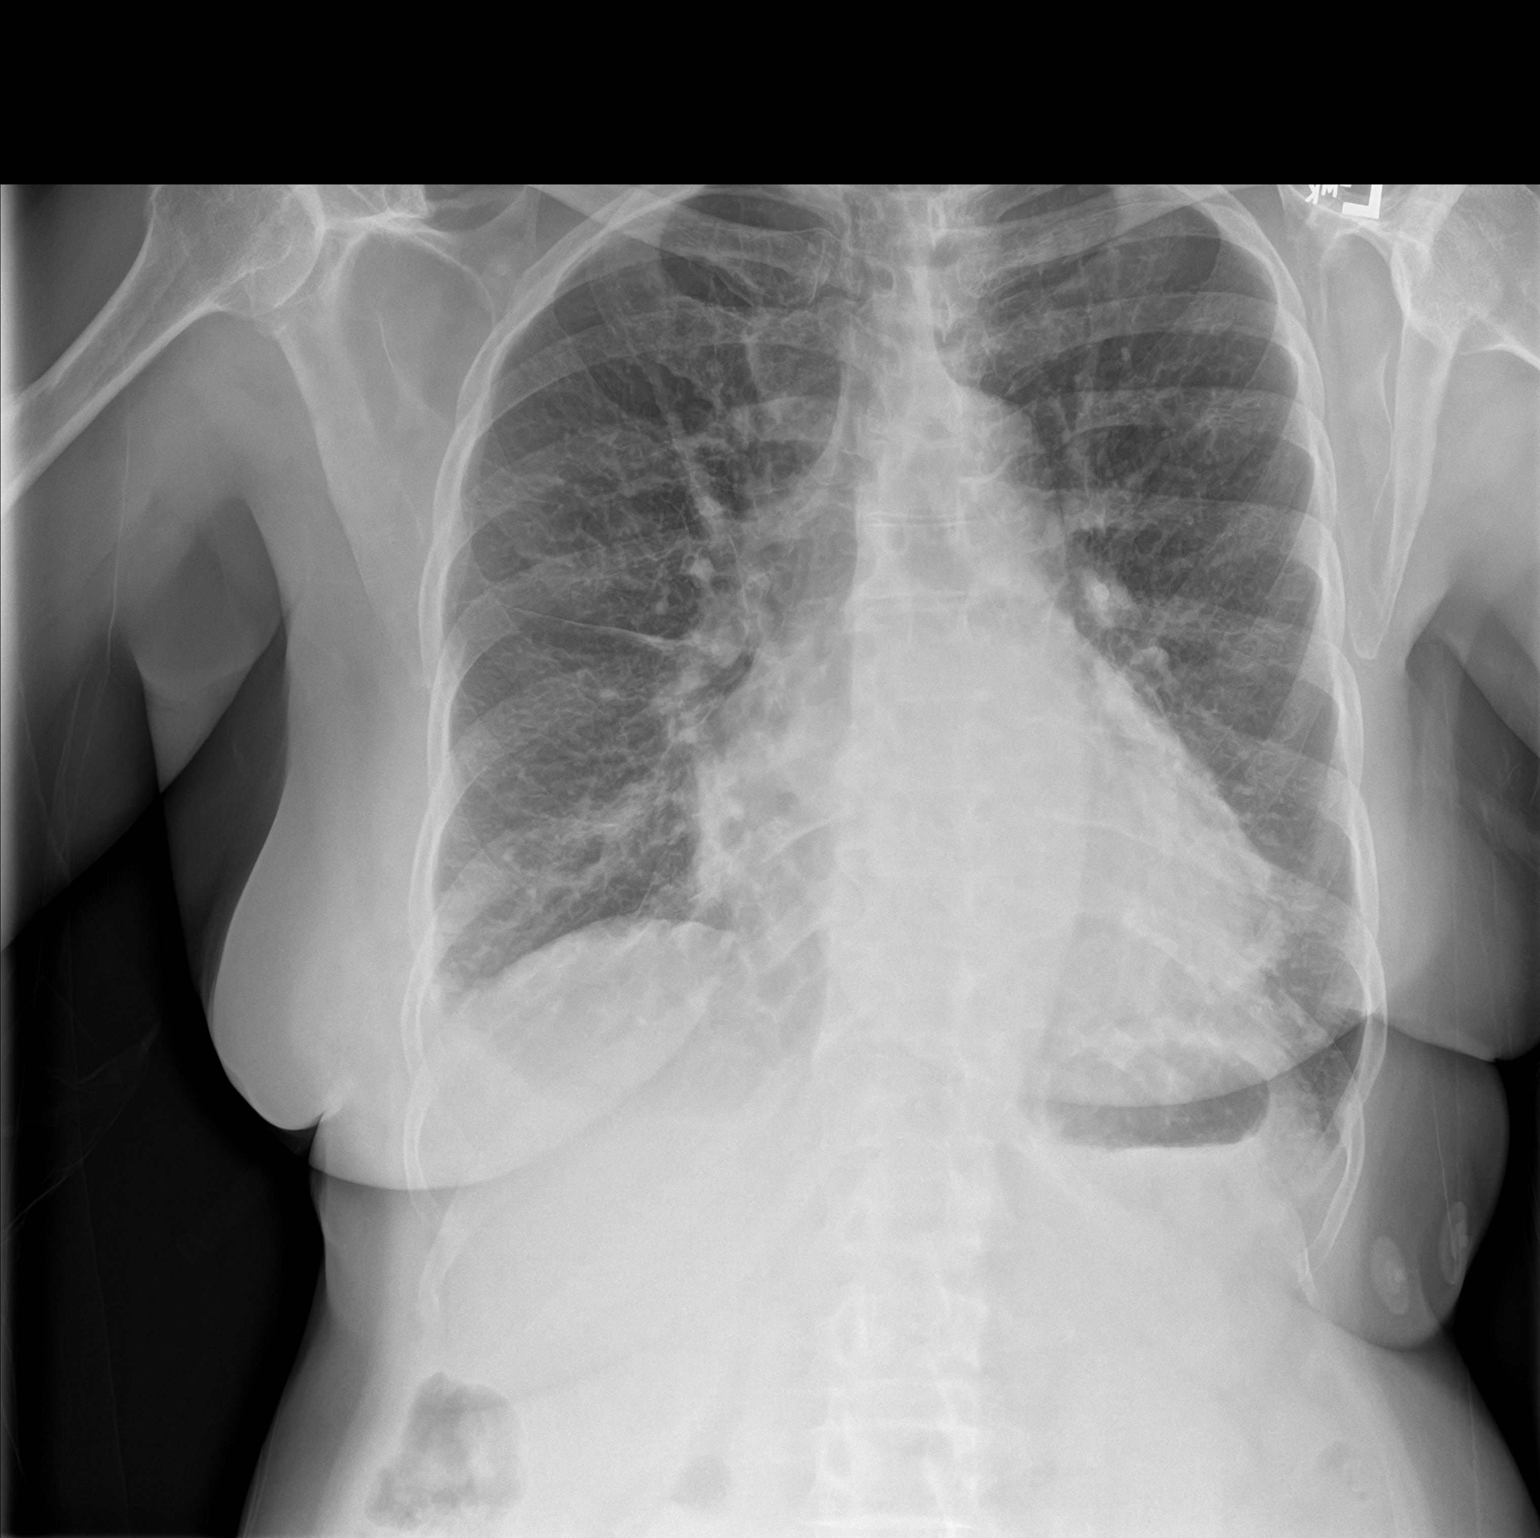
[im 2/2]
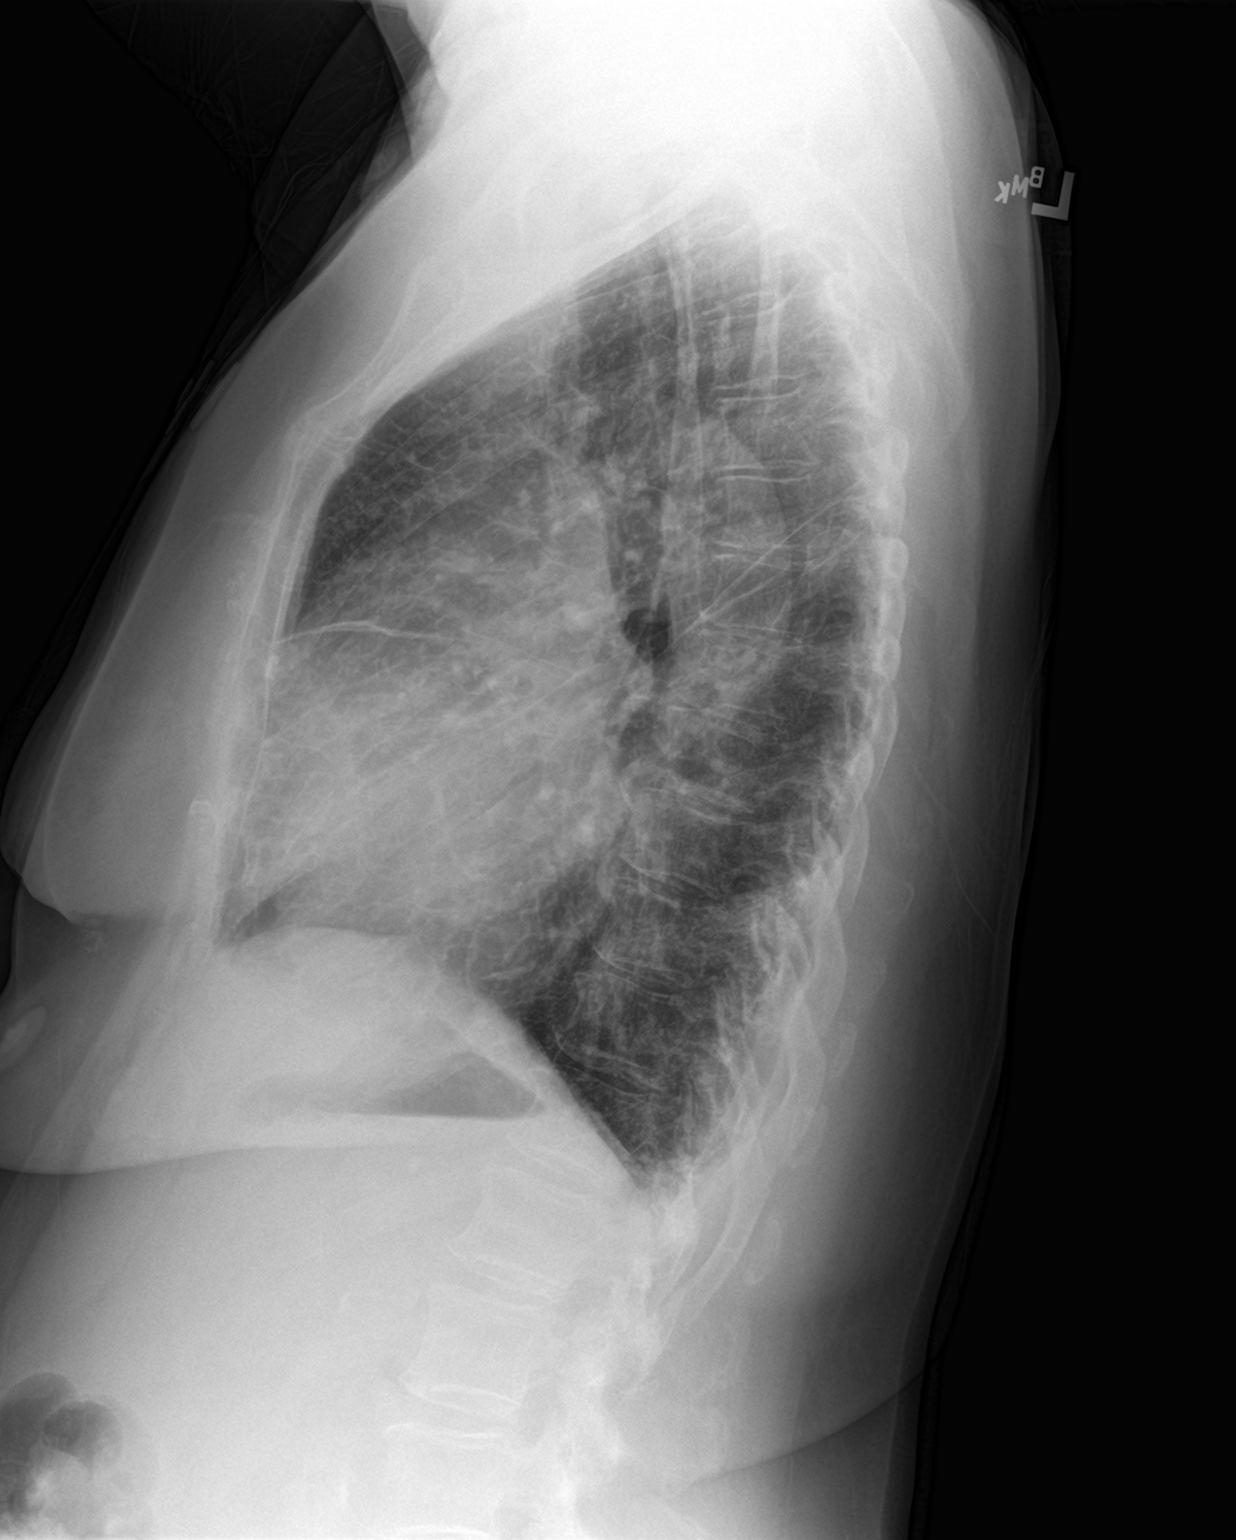

[2 of 2 positions shown; findings below may reference images not displayed]

FINDINGS: There is diffuse chronic interstitial coarsening. A small right
pleural effusion is suspected. No focal consolidation or
pneumothorax. Mild cardiomegaly. Atherosclerotic calcification of
the aorta. No acute osseous pathology.
IMPRESSION: Probable small right pleural effusion.  No focal consolidation.
